# Patient Record
Sex: Female | Born: 1960 | Race: White | Hispanic: No | Marital: Married | Smoking: Current every day smoker
Health system: Southern US, Community
[De-identification: ages and names within clinical notes are randomized; demographics above are authoritative.]

## PROBLEM LIST (undated history)

## (undated) DIAGNOSIS — G8929 Other chronic pain: Secondary | ICD-10-CM

## (undated) DIAGNOSIS — K219 Gastro-esophageal reflux disease without esophagitis: Secondary | ICD-10-CM

## (undated) DIAGNOSIS — N9983 Residual ovary syndrome: Secondary | ICD-10-CM

## (undated) HISTORY — DX: Other chronic pain: G89.29

## (undated) HISTORY — DX: Residual ovary syndrome: N99.83

## (undated) HISTORY — DX: Gastro-esophageal reflux disease without esophagitis: K21.9

---

## 1998-06-12 HISTORY — PX: ABDOMINAL HYSTERECTOMY: SHX81

## 2002-06-12 DIAGNOSIS — N9983 Residual ovary syndrome: Secondary | ICD-10-CM

## 2002-06-12 HISTORY — PX: TUMOR REMOVAL: SHX12

## 2002-06-12 HISTORY — DX: Residual ovary syndrome: N99.83

## 2006-06-12 HISTORY — PX: FRACTURE SURGERY: SHX138

## 2011-03-22 LAB — HM MAMMOGRAPHY

## 2011-03-30 LAB — HM PAP SMEAR: HM PAP: NORMAL

## 2014-07-07 ENCOUNTER — Encounter: Payer: Self-pay | Admitting: Family Medicine

## 2014-07-07 DIAGNOSIS — K219 Gastro-esophageal reflux disease without esophagitis: Secondary | ICD-10-CM | POA: Insufficient documentation

## 2014-07-07 DIAGNOSIS — G8929 Other chronic pain: Secondary | ICD-10-CM | POA: Insufficient documentation

## 2014-07-22 ENCOUNTER — Encounter: Payer: Self-pay | Admitting: Physician Assistant

## 2014-07-22 ENCOUNTER — Ambulatory Visit (INDEPENDENT_AMBULATORY_CARE_PROVIDER_SITE_OTHER): Admitting: Physician Assistant

## 2014-07-22 VITALS — BP 124/70 | HR 72 | Temp 97.8°F | Resp 18 | Ht 66.5 in | Wt 137.0 lb

## 2014-07-22 DIAGNOSIS — F17200 Nicotine dependence, unspecified, uncomplicated: Secondary | ICD-10-CM

## 2014-07-22 DIAGNOSIS — G8929 Other chronic pain: Secondary | ICD-10-CM

## 2014-07-22 DIAGNOSIS — Z720 Tobacco use: Secondary | ICD-10-CM

## 2014-07-22 DIAGNOSIS — K219 Gastro-esophageal reflux disease without esophagitis: Secondary | ICD-10-CM

## 2014-07-22 MED ORDER — OXYCODONE-ACETAMINOPHEN 10-325 MG PO TABS: 1.0000 | ORAL_TABLET | Freq: Four times a day (QID) | ORAL | Status: DC | PRN

## 2014-07-22 MED ORDER — OXYCODONE HCL ER 80 MG PO T12A
80.0000 mg | EXTENDED_RELEASE_TABLET | Freq: Two times a day (BID) | ORAL | Status: DC
Start: 2014-07-22 — End: 2014-08-19

## 2014-07-22 NOTE — Progress Notes (Signed)
Patient ID: Ebelyn Bohnet MRN: [id number], DOB: 1961/01/20, 54 y.o. Date of Encounter: @  Chief Complaint:  Chief Complaint  Patient presents with  . New Patient--Establish    needs to discuss pain management, has chronic leg pain s/p accident    HPI: 54 y.o. year old white female  presents as new patient to establish care.  She states that she just recently moved here from [place]. However only was in [place] for about one year. Prior to that she was in [place]. "Had been in [place] forever ".  Says that while she was living in [place] she just continue to go back to her prior PCP in [place].    She had an injury from a horse accident. Has scars on her left leg from the surgery. She says that there is a metal plate there with lots of pins in her leg still. Says that it was her PCP in [place] who has been prescribing her pain medications. Never been seen in  a pain clinic. Says that that PCP gradually had to increase the dose. She was on Oxy 40 mg for couple of years then gradually went up to  for a while and then up to 80 mg now.  Says that she last saw gynecologist around 2008. Since then she was just seeing her PCP for GYN care. Asked about her Estratest and she says that she was put on some type of hormone starting around 1995.  Says now she remembers that she was switched to the Estratest in the year 2000.  Says at that time she was 54 years old and was divorced but was in a new relationship and complained of decreased libido so was prescribed Estratest. Has continued on this ever since.  She says that April 26 2014 she had to have all of her teeth removed and dentures placed.  Regarding smoking she says that in the past she was prescribed Wellbutrin. When she was on Wellbutrin she could hardly get out of bed. Says that she didn't care about anything. Says that recently she has been using "Vap" -- however finds that after eating dinner she has to have a real  cigarette.  Says that her last pelvic exam and mammogram were over 1 year ago. She is not fasting today.   Past Medical History  Diagnosis Date  . GERD (gastroesophageal reflux disease)   . Chronic pain   . Ovarian remnant syndrome 2004     Home Meds: Outpatient Prescriptions Prior to Visit  Medication Sig Dispense Refill  . acyclovir (ZOVIRAX) 400 MG tablet Take 400 mg by mouth as needed.     Marland Kitchen estrogen-methylTESTOSTERone (ESTRATEST) 1.25-2.5 MG per tablet Take 1 tablet by mouth daily.    Marland Kitchen omeprazole (PRILOSEC) 20 MG capsule Take 20 mg by mouth daily.    . polyethylene glycol (MIRALAX / GLYCOLAX) packet Take 17 g by mouth daily.    . OxyCODONE (OXYCONTIN) 80 mg T12A 12 hr tablet Take 80 mg by mouth every 12 (twelve) hours.    Marland Kitchen oxyCODONE-acetaminophen (PERCOCET) 10-325 MG per tablet Take 1 tablet by mouth every 6 (six) hours as needed for pain.     No facility-administered medications prior to visit.    Allergies:  Allergies  Allergen Reactions  . Demerol [Meperidine] Other (See Comments)    Hallucinations  . Morphine And Related Itching and Rash  . Sulfa Antibiotics Rash    History   Social History  . Marital Status: Married  Spouse Name: N/A  . Number of Children: N/A  . Years of Education: N/A   Occupational History  . Not on file.   Social History Main Topics  . Smoking status: Current Every Day Smoker -- 0.50 packs/day for 35 years    Types: Cigarettes  . Smokeless tobacco: Never Used  . Alcohol Use: No  . Drug Use: No  . Sexual Activity: Yes    Birth Control/ Protection: Surgical   Other Topics Concern  . 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    When she has to leave for appointments etc. she has to arrange for other family to care for him.    Family History  Problem Relation Age of Onset  . Miscarriages /  Stillbirths Maternal Grandmother   . Alzheimer's disease Maternal Grandmother   . Arthritis Maternal Grandfather   . Hyperlipidemia Maternal Grandfather   . Hypertension Maternal Grandfather   . Stroke Maternal Grandfather   . Arthritis Paternal Grandmother   . Stroke Paternal Grandmother   . Hearing loss Paternal Grandfather   . Hypertension Paternal Grandfather   . Stroke Paternal Grandfather      Review of Systems:  See HPI for pertinent ROS. All other ROS negative.    Physical Exam: Blood pressure 124/70, pulse 72, temperature 97.8 F (36.6 C), temperature source Oral, resp. rate 18, height 5' 6.5" (1.689 m), weight 137 lb (62.143 kg)., Body mass index is 21.78 kg/(m^2). General: WNWD WF. Appears in no acute distress. Neck: Supple. No thyromegaly. No lymphadenopathy. No carotid bruits. Lungs: Clear bilaterally to auscultation without wheezes, rales, or rhonchi. Breathing is unlabored. Heart: RRR with S1 S2. No murmurs, rubs, or gallops. Abdomen: Soft, non-tender, non-distended with normoactive bowel sounds. No hepatomegaly. No rebound/guarding. No obvious abdominal masses. Musculoskeletal:  Strength and tone normal for age. Scar down anterior aspect of left lower leg.  Extremities/Skin: Warm and dry.  No edema.  Neuro: Alert and oriented X 3. Moves all extremities spontaneously. Gait is normal. CNII-XII grossly in tact. Psych:  Responds to questions appropriately with a normal affect.     ASSESSMENT AND PLAN:  54 y.o. year old female with  1. Gastroesophageal reflux disease, esophagitis presence not specified  2. Chronic pain - Ambulatory referral to Pain Clinic - oxyCODONE-acetaminophen (PERCOCET) 10-325 MG per tablet; Take 1 tablet by mouth every 6 (six) hours as needed for pain.  Dispense: 30 tablet; Refill: 0 After I printed this prescription and gave it to her she said that in her prior provider would give her enough of these that she could take them every 6  hours. Commented that she was taking that much of this in addition to the ox CAD twice a day and she said yes. I told her that this #30 would last her until her next follow-up appointment with me. In the meantime we will also be working on getting her in with the pain clinic as I will not continue to prescribe these medications.  - OxyCODONE (OXYCONTIN) 80 mg T12A 12 hr tablet; Take 1 tablet (80 mg total) by mouth every 12 (twelve) hours.  Dispense: 60 tablet; Refill: 0  Today I have reviewed records she has from [place] that indicates that she was indeed prescribed Oxycontin 80mg  #60 and OxyAPAP 10 #120 multiple, multiple times. And that  she had been on pain meds since 2008.   3. Smoker Adverse effects with Wellbutrin in the past. Currently trying to decrease her smoking and using "Vap" instead.   She is agreeable to schedule a follow-up visit in approximately one week as a complete physical exam. She will schedule this early morning so she can come fasting to that appointment.   [address] [place], [place], [facility] 07/22/2014 12:37 PM

## 2014-08-03 ENCOUNTER — Ambulatory Visit (INDEPENDENT_AMBULATORY_CARE_PROVIDER_SITE_OTHER): Admitting: Physician Assistant

## 2014-08-03 ENCOUNTER — Encounter: Payer: Self-pay | Admitting: Physician Assistant

## 2014-08-03 VITALS — BP 116/68 | HR 68 | Temp 98.0°F | Resp 18 | Ht 65.75 in | Wt 139.0 lb

## 2014-08-03 DIAGNOSIS — Z720 Tobacco use: Secondary | ICD-10-CM

## 2014-08-03 DIAGNOSIS — Z23 Encounter for immunization: Secondary | ICD-10-CM

## 2014-08-03 DIAGNOSIS — K219 Gastro-esophageal reflux disease without esophagitis: Secondary | ICD-10-CM

## 2014-08-03 DIAGNOSIS — Z0000 Encounter for general adult medical examination without abnormal findings: Secondary | ICD-10-CM

## 2014-08-03 DIAGNOSIS — G8929 Other chronic pain: Secondary | ICD-10-CM

## 2014-08-03 DIAGNOSIS — R3915 Urgency of urination: Secondary | ICD-10-CM

## 2014-08-03 DIAGNOSIS — N390 Urinary tract infection, site not specified: Secondary | ICD-10-CM

## 2014-08-03 DIAGNOSIS — F17200 Nicotine dependence, unspecified, uncomplicated: Secondary | ICD-10-CM

## 2014-08-03 DIAGNOSIS — R319 Hematuria, unspecified: Secondary | ICD-10-CM

## 2014-08-03 LAB — CBC WITH DIFFERENTIAL/PLATELET
BASOS ABS: 0.1 10*3/uL (ref 0.0–0.1)
Basophils Relative: 1 % (ref 0–1)
EOS PCT: 2 % (ref 0–5)
Eosinophils Absolute: 0.2 10*3/uL (ref 0.0–0.7)
HEMATOCRIT: 46.4 % — AB (ref 36.0–46.0)
Hemoglobin: 15.6 g/dL — ABNORMAL HIGH (ref 12.0–15.0)
LYMPHS ABS: 2.8 10*3/uL (ref 0.7–4.0)
Lymphocytes Relative: 37 % (ref 12–46)
MCH: 31 pg (ref 26.0–34.0)
MCHC: 33.6 g/dL (ref 30.0–36.0)
MCV: 92.1 fL (ref 78.0–100.0)
MONOS PCT: 9 % (ref 3–12)
MPV: 8.9 fL (ref 8.6–12.4)
Monocytes Absolute: 0.7 10*3/uL (ref 0.1–1.0)
Neutro Abs: 3.8 10*3/uL (ref 1.7–7.7)
Neutrophils Relative %: 51 % (ref 43–77)
Platelets: 311 10*3/uL (ref 150–400)
RBC: 5.04 MIL/uL (ref 3.87–5.11)
RDW: 13.6 % (ref 11.5–15.5)
WBC: 7.5 10*3/uL (ref 4.0–10.5)

## 2014-08-03 LAB — URINALYSIS, ROUTINE W REFLEX MICROSCOPIC
GLUCOSE, UA: NEGATIVE mg/dL
Nitrite: POSITIVE — AB
PROTEIN: 100 mg/dL — AB
Specific Gravity, Urine: 1.02 (ref 1.005–1.030)
Urobilinogen, UA: 1 mg/dL (ref 0.0–1.0)
pH: 5.5 (ref 5.0–8.0)

## 2014-08-03 LAB — LIPID PANEL
CHOL/HDL RATIO: 3 ratio
CHOLESTEROL: 122 mg/dL (ref 0–200)
HDL: 41 mg/dL — AB (ref >=46)
LDL Cholesterol: 69 mg/dL (ref 0–99)
Triglycerides: 61 mg/dL (ref <150)
VLDL: 12 mg/dL (ref 0–40)

## 2014-08-03 LAB — URINALYSIS, MICROSCOPIC ONLY
Casts: NONE SEEN
Crystals: NONE SEEN

## 2014-08-03 LAB — COMPLETE METABOLIC PANEL WITH GFR
ALK PHOS: 122 U/L — AB (ref 39–117)
AST: 11 U/L (ref 0–37)
Albumin: 3.9 g/dL (ref 3.5–5.2)
BUN: 9 mg/dL (ref 6–23)
CALCIUM: 9.5 mg/dL (ref 8.4–10.5)
CHLORIDE: 102 meq/L (ref 96–112)
CO2: 25 mEq/L (ref 19–32)
Creat: 0.98 mg/dL (ref 0.50–1.10)
GFR, Est African American: 77 mL/min
GFR, Est Non African American: 67 mL/min
GLUCOSE: 88 mg/dL (ref 70–99)
Potassium: 4.4 mEq/L (ref 3.5–5.3)
Sodium: 140 mEq/L (ref 135–145)
Total Bilirubin: 0.9 mg/dL (ref 0.2–1.2)
Total Protein: 6.7 g/dL (ref 6.0–8.3)

## 2014-08-03 LAB — TSH: TSH: 3.746 u[IU]/mL (ref 0.350–4.500)

## 2014-08-03 MED ORDER — OXYCODONE-ACETAMINOPHEN 10-325 MG PO TABS: 1.0000 | ORAL_TABLET | Freq: Four times a day (QID) | ORAL | Status: DC | PRN

## 2014-08-03 MED ORDER — CIPROFLOXACIN HCL 500 MG PO TABS: 500.0000 mg | ORAL_TABLET | Freq: Two times a day (BID) | ORAL | Status: DC

## 2014-08-03 NOTE — Progress Notes (Signed)
Patient ID: Latasha Martinez MRN: [id number], DOB: [birth year], 54 y.o. Date of Encounter: 08/03/2014,   Chief Complaint: Physical (CPE)  HPI: 54 y.o. y/o white female  here for CPE.   Prior to today's visit, she has only been seen in our office once prior to today. She saw me as a new patient to establish care on 07/22/14. Prior to that she had been receiving [place] medical care in [place] .  At that visit we discussed in detail that she had had an injury from an horse accident and has been on pain medications since then. At that visit I made referral to pain clinic. I also printed prescription for Oxy Contin 80 mg every 12 hours #60. Also printed a prescription for Percocet 10/325 for just #30. After I printed prescription and gave it to her that day, she said that her prior provider was prescribing enough that she could take 1 every 6 hours and that was the way she had been taking it. I even discussed the fact that she was taking that much of the Percocet in addition to the OxyContin's and she said she was. I told her to just use that prescription to hold her over until this follow-up appointment. Today she is needing further prescription for further Percocet. Also I did review records from [place] that indicated that she they have been prescribing OxyContin 80 #60 and OxyAPAP at 10 for #120---- multiple, multiple times.  She also reports that she has been having some dysuria as well as some urgency and frequency and noticing that her urine looks very cloudy.  No other complaints or concerns today.    Review of Systems: Consitutional: No fever, chills, fatigue, night sweats, lymphadenopathy. No significant/unexplained weight changes. Eyes: No visual changes, eye redness, or discharge. ENT/Mouth: No ear pain, sore throat, nasal drainage, or sinus pain. Cardiovascular: No chest pressure,heaviness, tightness or squeezing, even with exertion. No increased shortness of breath or  dyspnea on exertion.No palpitations, edema, orthopnea, PND. Respiratory: No cough, hemoptysis, SOB, or wheezing. Gastrointestinal: No anorexia, dysphagia, reflux, pain, nausea, vomiting, hematemesis, diarrhea, constipation, BRBPR, or melena. Breast: No mass, nodules, bulging, or retraction. No skin changes or inflammation. No nipple discharge. No lymphadenopathy. Genitourinary: No dysuria, hematuria, incontinence, vaginal discharge, pruritis, burning, abnormal bleeding, or pain. Musculoskeletal: Chronic Pain--see HPI Skin: No rash, pruritis, or concerning lesions. Neurological: No headache, dizziness, syncope, seizures, tremors, memory loss, coordination problems, or paresthesias. Psychological: No anxiety, depression, hallucinations, SI/HI. Endocrine: No polydipsia, polyphagia, polyuria, or known diabetes.No increased fatigue. No palpitations/rapid heart rate. No significant/unexplained weight change. All other systems were reviewed and are otherwise negative.  Past Medical History  Diagnosis Date  . GERD (gastroesophageal reflux disease)   . Chronic pain   . Ovarian remnant syndrome 2004     Past Surgical History  Procedure Laterality Date  . Fracture surgery  2008    left leg kicked by horse  . Abdominal hysterectomy  2000  . Tumor removal  2004    ovarian remnant syndrome    Home Meds:  Outpatient Prescriptions Prior to Visit  Medication Sig Dispense Refill  . acyclovir (ZOVIRAX) 400 MG tablet Take 400 mg by mouth as needed.     Marland Kitchen. estrogen-methylTESTOSTERone (ESTRATEST) 1.25-2.5 MG per tablet Take 1 tablet by mouth daily.    Marland Kitchen. omeprazole (PRILOSEC) 20 MG capsule Take 20 mg by mouth daily.    . OxyCODONE (OXYCONTIN) 80 mg T12A 12 hr tablet Take 1 tablet (80 mg total) by  mouth every 12 (twelve) hours. 60 tablet 0  . polyethylene glycol (MIRALAX / GLYCOLAX) packet Take 17 g by mouth daily.    Marland Kitchen oxyCODONE-acetaminophen (PERCOCET) 10-325 MG per tablet Take 1 tablet by mouth every 6  (six) hours as needed for pain. 30 tablet 0   No facility-administered medications prior to visit.    Allergies:  Allergies  Allergen Reactions  . Demerol [Meperidine] Other (See Comments)    Hallucinations  . Morphine And Related Itching and Rash  . Sulfa Antibiotics Rash    History   Social History  . Marital Status: Married    Spouse Name: N/A  . Number of Children: N/A  . Years of Education: N/A   Occupational History  . Not on file.   Social History Main Topics  . Smoking status: Current Every Day Smoker -- 0.50 packs/day for 35 years    Types: Cigarettes  . Smokeless tobacco: Never Used  . Alcohol Use: No  . Drug Use: No  . Sexual Activity: Yes    Birth Control/ Protection: Surgical   Other Topics Concern  . 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    When she has to leave for appointments etc. she has to arrange for other family to care for him.    Family History  Problem Relation Age of Onset  . Miscarriages / Stillbirths Maternal Grandmother   . Alzheimer's disease Maternal Grandmother   . Arthritis Maternal Grandfather   . Hyperlipidemia Maternal Grandfather   . Hypertension Maternal Grandfather   . Stroke Maternal Grandfather   . Arthritis Paternal Grandmother   . Stroke Paternal Grandmother   . Hearing loss Paternal Grandfather   . Hypertension Paternal Grandfather   . Stroke Paternal Grandfather     Physical Exam: Blood pressure 116/68, pulse 68, temperature 98 F (36.7 C), temperature source Oral, resp. rate 18, height 5' 5.75" (1.67 m), weight 139 lb (63.05 kg)., Body mass index is 22.61 kg/(m^2). General: Well developed, well nourished, WF. Appears in no acute distress. HEENT: Normocephalic, atraumatic. Conjunctiva pink, sclera non-icteric. Pupils 2 mm constricting to 1 mm, round, regular, and equally  reactive to light and accomodation. EOMI. Internal auditory canal clear. TMs with good cone of light and without pathology. Nasal mucosa pink. Nares are without discharge. No sinus tenderness. Oral mucosa pink.  Pharynx without exudate.   Neck: Supple. Trachea midline. No thyromegaly. Full ROM. No lymphadenopathy.No Carotid Bruits. Lungs: Clear to auscultation bilaterally without wheezes, rales, or rhonchi. Breathing is of normal effort and unlabored. Cardiovascular: RRR with S1 S2. No murmurs, rubs, or gallops. Distal pulses 2+ symmetrically. No carotid or abdominal bruits. Breast: Symmetrical. No masses. Nipples without discharge. Abdomen: Soft, non-tender, non-distended with normoactive bowel sounds. No hepatosplenomegaly or masses. No rebound/guarding. No CVA tenderness. No hernias.  Genitourinary:  External genitalia without lesions. Vaginal mucosa pink. She has had hysterectomy. Exam c/w this. No masses or tenderness.  Musculoskeletal: Full range of motion and 5/5 strength throughout.  Extremities without clubbing, cyanosis, or edema.  Skin: Warm and moist without erythema, ecchymosis, wounds, or rash. Neuro: A+Ox3. CN II-XII grossly intact. Moves all extremities spontaneously. Full sensation throughout. Normal gait. DTR 2+ throughout upper and lower extremities.  Psych:  Responds to questions appropriately with a normal affect.   Assessment/Plan:  54 y.o. y/o  female here for CPE 1. Visit for preventive health examination  A. Screening Labs: - CBC with Differential/Platelet - COMPLETE METABOLIC PANEL WITH GFR - Lipid panel - TSH - Vit D  25 hydroxy (rtn osteoporosis monitoring)   B. Pap: She has had complete hysterectomy. She states this was not secondary to any cancer. Therefore, no further Pap smear indicated.  C. Screening Mammogram: She has had no mammogram in > 1 year. She is agreeable for me to refer for follow-up mammogram. - MM Digital Screening; Future  D. DEXA/BMD:    Wait to discuss bone density scanning at future visits. Discuss her age at time of her hysterectomy to determine when we need to start doing bone density scans.  E. Colorectal Cancer Screening: She reports she has never had a colonoscopy. She is agreeable to follow-up for screening colonoscopy.  Ambulatory referral to Gastroenterology   F. Immunizations:  Influenza:  She refuses to have influenza vaccine. Tetanus:  She thinks it has been close to 10 years since her last tetanus vaccine and she is agreeable to go ahead and update this today. Pneumococcal:  She reports that she has never had a pneumonia vaccine. Her smoking she needs to have Pneumovax 23. She is agreeable to receive this today.                          Then need to get Prevnar 13 at age 8. Zostavax: Not indicated until age 21.    2. Urgency of urination - Urinalysis, Routine w reflex microscopic  3. Smoker At last office visit we discussed smoking cessation. She reported that she had used Wellbutrin in the past but it caused adverse effects. He is trying to decrease the amount of smoking on her own.  4. Chronic pain At her visit 07/22/14 I gave her prescription for #30 of Percocet. However she says that she takes one 4 times daily, which is 120 per month.  Therefore I will give her the other 90 now to cover for the month supply.  - oxyCODONE-acetaminophen (PERCOCET) 10-325 MG per tablet; Take 1 tablet by mouth every 6 (six) hours as needed for pain.  Dispense: 90 tablet; Refill: 0  At her visit 07/22/14 I gave her a full month supply of her OxyContin.  Also I ordered referral to pain clinic at her visit 07/22/14.  5. Gastroesophageal reflux disease, esophagitis presence not specified   6. Urinary tract infection with hematuria, site unspecified - ciprofloxacin (CIPRO) 500 MG tablet; Take 1 tablet (500 mg total) by mouth 2 (two) times daily.  Dispense: 10 tablet; Refill: 0  7. Need for Tdap vaccination - Tdap  vaccine greater than or equal to 7yo IM  8. Need for prophylactic vaccination against Streptococcus pneumoniae (pneumococcus) - Pneumococcal polysaccharide vaccine 23-valent greater than or equal to 2yo subcutaneous/IM   Signed, [address] [place], [place], [facility] 08/03/2014 1:49 PM

## 2014-08-04 ENCOUNTER — Encounter: Payer: Self-pay | Admitting: *Deleted

## 2014-08-04 ENCOUNTER — Encounter (INDEPENDENT_AMBULATORY_CARE_PROVIDER_SITE_OTHER): Payer: Self-pay | Admitting: *Deleted

## 2014-08-04 LAB — VITAMIN D 25 HYDROXY (VIT D DEFICIENCY, FRACTURES): Vit D, 25-Hydroxy: 12 ng/mL — ABNORMAL LOW (ref 30–100)

## 2014-08-06 ENCOUNTER — Other Ambulatory Visit: Payer: Self-pay | Admitting: Family Medicine

## 2014-08-06 ENCOUNTER — Telehealth: Payer: Self-pay | Admitting: Family Medicine

## 2014-08-06 DIAGNOSIS — E559 Vitamin D deficiency, unspecified: Secondary | ICD-10-CM

## 2014-08-06 MED ORDER — VITAMIN D (ERGOCALCIFEROL) 1.25 MG (50000 UNIT) PO CAPS: 50000.0000 [IU] | ORAL_CAPSULE | ORAL | Status: DC

## 2014-08-06 NOTE — Telephone Encounter (Signed)
-----   Message from Dorena BodoMary B Dixon, PA-C sent at 08/04/2014  2:48 PM EST ----- Add Vitamin D Deficiency to her Problem List so I will make sure to f/u this at next OV. Thanks.

## 2014-08-06 NOTE — Telephone Encounter (Signed)
-----   Message from Dorena BodoMary B Dixon, PA-C sent at 08/04/2014  2:47 PM EST ----- Treated UTI at OV. Vitamin D very low. Tell her to take Rx---Ergocalciferol 50,000 IU Q Week x 12 Weeks. ------THEN take otc VitD 4,000 IU QD ---make sure to have f/u OV 6 months--will Recheck level then Remainder of labs normal.

## 2014-08-06 NOTE — Telephone Encounter (Signed)
Tried to call pt, voice mail full.  Rx for Vit D to pharmacy.  Pt already has 6 mth appt.

## 2014-08-06 NOTE — Telephone Encounter (Signed)
error 

## 2014-08-11 NOTE — Telephone Encounter (Signed)
Finally able to reach pt.  Aware of Vit D Rx and how to take.  Switch to OTC when Rx completed.  Repeat in 6 months

## 2014-08-18 ENCOUNTER — Telehealth: Payer: Self-pay | Admitting: Physician Assistant

## 2014-08-18 DIAGNOSIS — G8929 Other chronic pain: Secondary | ICD-10-CM

## 2014-08-18 NOTE — Telephone Encounter (Signed)
Patient is calling to get refills on her oxycontin and oxycodone  [phone]

## 2014-08-18 NOTE — Telephone Encounter (Signed)
Oxycontin was RF 07/22/14 #60  Oxycodone (Percocet) was Rf 08/03/14 #90  Last OV 08/03/14  OK refill?

## 2014-08-19 MED ORDER — OXYCODONE-ACETAMINOPHEN 10-325 MG PO TABS: 1.0000 | ORAL_TABLET | Freq: Four times a day (QID) | ORAL | Status: DC | PRN

## 2014-08-19 MED ORDER — OXYCODONE HCL ER 80 MG PO T12A: 80.0000 mg | EXTENDED_RELEASE_TABLET | Freq: Two times a day (BID) | ORAL | Status: DC

## 2014-08-19 NOTE — Telephone Encounter (Signed)
Rx's printed.  Tried to call pt.  No answer, voice mail full.

## 2014-08-19 NOTE — Telephone Encounter (Signed)
FYI--- referral to pain clinic was ordered 07/22/14 Patient was seen as a new patient at that time. Can print prescription for the oxycodone 10+325 one every 6 hours for #120+0. (Previously I have given her one for #30 and another one for #90 because I did not know at that time that she usually gets 120 per month) Also can print prescription for OxyContin 80 mg 1 every 12 hours for #60+0.

## 2014-08-28 ENCOUNTER — Other Ambulatory Visit: Payer: Self-pay | Admitting: Physician Assistant

## 2014-08-28 DIAGNOSIS — Z1231 Encounter for screening mammogram for malignant neoplasm of breast: Secondary | ICD-10-CM

## 2014-09-15 ENCOUNTER — Ambulatory Visit: Payer: Self-pay

## 2014-09-17 ENCOUNTER — Telehealth: Payer: Self-pay | Admitting: Physician Assistant

## 2014-09-17 DIAGNOSIS — G8929 Other chronic pain: Secondary | ICD-10-CM

## 2014-09-17 MED ORDER — OXYCODONE-ACETAMINOPHEN 10-325 MG PO TABS: 1.0000 | ORAL_TABLET | Freq: Four times a day (QID) | ORAL | Status: DC | PRN

## 2014-09-17 MED ORDER — ACYCLOVIR 400 MG PO TABS: 400.0000 mg | ORAL_TABLET | Freq: Three times a day (TID) | ORAL | Status: DC | PRN

## 2014-09-17 MED ORDER — OXYCODONE HCL ER 80 MG PO T12A: 80.0000 mg | EXTENDED_RELEASE_TABLET | Freq: Two times a day (BID) | ORAL | Status: DC

## 2014-09-17 NOTE — Telephone Encounter (Signed)
rx signed by provider.  No answer when tried to call patient

## 2014-09-17 NOTE — Telephone Encounter (Signed)
Patient is calling to get refills on OxyContin, Oxycodone, and acyclovir  Please call her at [phone] if any questions

## 2014-09-17 NOTE — Telephone Encounter (Signed)
Approved for #120+0 of the oxycodone 10+325 Approved for #60+0 of the OxyContin 80 BUT, MUST F/U STATUS OF REFERRAL TO PAIN CLINIC AND DOCUMENT IN EPIC  SO WILL HAVE THIS INFO AVAILABLE PRIOR TO NEXT REFILL.

## 2014-09-17 NOTE — Telephone Encounter (Signed)
LRF's both 08/19/14  LOV 08/03/14  OK refill?

## 2014-09-18 NOTE — Telephone Encounter (Signed)
Tried to call pt again.  No answer and mail box is full.  Rx's are ready for pick up

## 2014-10-02 ENCOUNTER — Encounter: Payer: Self-pay | Admitting: *Deleted

## 2014-10-14 ENCOUNTER — Telehealth: Payer: Self-pay | Admitting: *Deleted

## 2014-10-14 NOTE — Telephone Encounter (Signed)
She had CPE 07/2014--Can refill to last until 07/2015--7 month supply

## 2014-10-14 NOTE — Telephone Encounter (Signed)
Pt is needing a refill on Estratest 1.25-2.5mg  says she is out and would like jto have these today.  Pt has new pharmacy [facility]

## 2014-10-15 MED ORDER — EST ESTROGENS-METHYLTEST 1.25-2.5 MG PO TABS: 1.0000 | ORAL_TABLET | Freq: Every day | ORAL | Status: DC

## 2014-10-15 NOTE — Telephone Encounter (Signed)
RX faxed to pharmacy.

## 2014-10-20 ENCOUNTER — Telehealth: Payer: Self-pay | Admitting: Physician Assistant

## 2014-10-20 NOTE — Telephone Encounter (Signed)
Last refill 09/17/14 for one month no refills.  Last office visit 08/03/14.  OK refill?

## 2014-10-20 NOTE — Telephone Encounter (Signed)
Patient needs rx for OxyContin and oxycodone   [phone] call when ready

## 2014-10-21 NOTE — Telephone Encounter (Signed)
Find out status of referral to pain clinic. 07/22/14 is when I ordered the referral to pain clinic.

## 2014-10-21 NOTE — Telephone Encounter (Signed)
Somebody is going to have to start calling this other provider's office multiple times a day to get these records until they send them to us.  It has been 3 MONTHS.  It takes 5 minutes to fax records.  Call and harass that office until they sen records!!!! I cannot continue prescribing these meds--has been 3 months. This is ridiculous!!

## 2014-10-21 NOTE — Telephone Encounter (Signed)
Dr Laurian Brim'Toole is waiting for records from previous providers.  We have sent for and are still waiting.  Leeroy BockChelsea is following up on record requests.

## 2014-10-22 ENCOUNTER — Ambulatory Visit (INDEPENDENT_AMBULATORY_CARE_PROVIDER_SITE_OTHER): Admitting: Physician Assistant

## 2014-10-22 ENCOUNTER — Encounter: Payer: Self-pay | Admitting: Physician Assistant

## 2014-10-22 VITALS — BP 120/70 | HR 57 | Temp 98.5°F | Resp 19 | Wt 132.0 lb

## 2014-10-22 DIAGNOSIS — M2662 Arthralgia of temporomandibular joint: Secondary | ICD-10-CM | POA: Diagnosis not present

## 2014-10-22 DIAGNOSIS — G8929 Other chronic pain: Secondary | ICD-10-CM | POA: Diagnosis not present

## 2014-10-22 DIAGNOSIS — M26629 Arthralgia of temporomandibular joint, unspecified side: Secondary | ICD-10-CM | POA: Insufficient documentation

## 2014-10-22 MED ORDER — OXYCODONE-ACETAMINOPHEN 10-325 MG PO TABS: 1.0000 | ORAL_TABLET | Freq: Four times a day (QID) | ORAL | Status: DC | PRN

## 2014-10-22 MED ORDER — OXYCODONE HCL ER 80 MG PO T12A: 80.0000 mg | EXTENDED_RELEASE_TABLET | Freq: Two times a day (BID) | ORAL | Status: DC

## 2014-10-22 NOTE — Progress Notes (Addendum)
Patient ID: Latasha Martinez MRN: [id number], DOB: 1960/10/01, 54 y.o. Date of Encounter: 10/22/2014, 4:32 PM    Chief Complaint:  Chief Complaint  Patient presents with  . right ear pain    yesterday night      HPI: 54 y.o. year old female resents with complaints of right ear pain.  She says that several weeks ago she woke up one night with severe pain in her right ear. However says that the following morning the ear was not hurting. He thought maybe she was just lying on her ear wrong and had pressed it. Says that a few nights later it happened again. However again no pain during the day. Says night before last was the last time she had severe pain. After that is when she called us and schedule appointment.  Says that every episode of severe pain has occurred at night. Says that sometimes the following day that ear feels a little bit sore but no significant pain like at night.  Has had no mucus from her nose no sore throat no cough or chest congestion.  She does grind her teeth at night. Also says that since she got these dentures she catches herself during the day even tapping her teeth together.     Home Meds:   Outpatient Prescriptions Prior to Visit  Medication Sig Dispense Refill  . acyclovir (ZOVIRAX) 400 MG tablet Take 1 tablet (400 mg total) by mouth 3 (three) times daily as needed. 90 tablet 0  . estrogen-methylTESTOSTERone (ESTRATEST) 1.25-2.5 MG per tablet Take 1 tablet by mouth daily. 30 tablet 5  . omeprazole (PRILOSEC) 20 MG capsule Take 20 mg by mouth daily.    . polyethylene glycol (MIRALAX / GLYCOLAX) packet Take 17 g by mouth daily.    . Vitamin D, Ergocalciferol, (DRISDOL) 50000 UNITS CAPS capsule Take 1 capsule (50,000 Units total) by mouth every 7 (seven) days. One tablet a week x 12 weeks, then start vitamin D3 4000 IU daily, over the counter. 12 capsule 0  . OxyCODONE (OXYCONTIN) 80 mg T12A 12 hr tablet Take 1 tablet (80 mg total) by mouth every 12  (twelve) hours. 60 tablet 0  . oxyCODONE-acetaminophen (PERCOCET) 10-325 MG per tablet Take 1 tablet by mouth every 6 (six) hours as needed for pain. 120 tablet 0  . ciprofloxacin (CIPRO) 500 MG tablet Take 1 tablet (500 mg total) by mouth 2 (two) times daily. (Patient not taking: Reported on 10/22/2014) 10 tablet 0   No facility-administered medications prior to visit.    Allergies:  Allergies  Allergen Reactions  . Demerol [Meperidine] Other (See Comments)    Hallucinations  . Morphine And Related Itching and Rash  . Sulfa Antibiotics Rash      Review of Systems: See HPI for pertinent ROS. All other ROS negative.    Physical Exam: Blood pressure 120/70, pulse 57, temperature 98.5 F (36.9 C), temperature source Oral, resp. rate 19, weight 132 lb (59.875 kg)., Body mass index is 21.47 kg/(m^2). General:  Appears in no acute distress. HEENT:Left Ear: Normal.  Right Ear: Normal.  The canals and tympanic membranes appear normally bilaterally. There is severe tenderness with palpation of the superior aspect of the right TM joint. Neck: Supple. No thyromegaly. No lymphadenopathy. Lungs: Clear bilaterally to auscultation without wheezes, rales, or rhonchi. Breathing is unlabored. Heart: Regular rhythm. No murmurs, rubs, or gallops. Msk:  Strength and tone normal for age. Extremities/Skin: Warm and dry. Neuro: Alert and oriented X 3. Moves  all extremities spontaneously. Gait is normal. CNII-XII grossly in tact. Psych:  Responds to questions appropriately with a normal affect.     ASSESSMENT AND PLAN:  54 y.o. year old female with  1. TMJ arthralgia She is to call her dentist and see about a mouth guard to prevent grinding/clenching. Also discussed avoiding hard crunchy foods and to eat soft foods. So avoid taking large bites of food such as a big hamburger/sandwich.  2. Chronic pain She transferred here as a new patient and was already on these pain medications. At the time that  we accepted her as a new patient she was aware that she would need to see pain clinic for these medications. Then in the process of getting her in with the pain clinic since then. She recently called needing refills and I had stated that we needed to find out the status of the pain clinic. Today patient states that Dr. Laurian Brim'Toole did call her and they did discuss some information but he was waiting to get her records to see if their clinic could be of any help to her. I talked to our staff today. States that they did just receive these records yesterday and that they have faxed them to Dr. Laurian Brim'Toole yesterday. Give her another month's supply to hold her over and hopefully she will be seeing the pain clinic after this.  - oxyCODONE-acetaminophen (PERCOCET) 10-325 MG per tablet; Take 1 tablet by mouth every 6 (six) hours as needed for pain.  Dispense: 120 tablet; Refill: 0 - OxyCODONE (OXYCONTIN) 80 mg T12A 12 hr tablet; Take 1 tablet (80 mg total) by mouth every 12 (twelve) hours.  Dispense: 60 tablet; Refill: 0   Signed, [address], [place], [facility] 10/22/2014 4:32 PM

## 2014-10-23 ENCOUNTER — Encounter: Payer: Self-pay | Admitting: Physician Assistant

## 2014-11-11 ENCOUNTER — Encounter: Payer: Self-pay | Admitting: Physician Assistant

## 2014-11-20 ENCOUNTER — Telehealth: Payer: Self-pay | Admitting: Physician Assistant

## 2014-11-20 DIAGNOSIS — G8929 Other chronic pain: Secondary | ICD-10-CM

## 2014-11-20 NOTE — Telephone Encounter (Signed)
Patient calling for refill of oxycontin 80mg  and oxycodone 10-325 mg [phone]

## 2014-11-20 NOTE — Telephone Encounter (Signed)
Ok to refill both??  Last office visit/ refill 10/22/2014.

## 2014-11-23 MED ORDER — OXYCODONE HCL ER 80 MG PO T12A: 80.0000 mg | EXTENDED_RELEASE_TABLET | Freq: Two times a day (BID) | ORAL | Status: DC

## 2014-11-23 MED ORDER — OXYCODONE-ACETAMINOPHEN 10-325 MG PO TABS
1.0000 | ORAL_TABLET | Freq: Four times a day (QID) | ORAL | Status: DC | PRN
Start: 2014-11-23 — End: 2014-12-23

## 2014-11-23 NOTE — Telephone Encounter (Signed)
Ok to refill both?? 

## 2014-11-23 NOTE — Telephone Encounter (Signed)
Prescription printed.   VM full and cannot accept new messages.

## 2014-11-30 ENCOUNTER — Encounter: Payer: Self-pay | Admitting: Physician Assistant

## 2014-12-23 ENCOUNTER — Other Ambulatory Visit: Payer: Self-pay | Admitting: Physician Assistant

## 2014-12-23 DIAGNOSIS — G8929 Other chronic pain: Secondary | ICD-10-CM

## 2014-12-23 MED ORDER — OXYCODONE-ACETAMINOPHEN 10-325 MG PO TABS: 1.0000 | ORAL_TABLET | Freq: Four times a day (QID) | ORAL | Status: DC | PRN

## 2014-12-23 MED ORDER — OXYCODONE HCL ER 80 MG PO T12A: 80.0000 mg | EXTENDED_RELEASE_TABLET | Freq: Two times a day (BID) | ORAL | Status: DC

## 2014-12-23 NOTE — Telephone Encounter (Signed)
I called patient and Dr Jon Gills'Toole's office.    Pt said nothing about having an appointment.  When asked about scheduled appt for 11/13/14, then said did not gone because of death in family.  Also told me she has spoken to office maybe 1.5-2 weeks ago and they told her they had rec'd her records and were reviewing and would get back to her if they felt they could help her.  Dr Jon Gills'Toole's office said they had already rec'd notes at time of initial referral and had called her with 11/13/14 appt which she NO SHOWED for.  No record of any conversations with her since then.  They gave me another appt for her for 01/14/15 at 8AM.  If no shows they will not see her.  LRFs 11/23/14  OK refill?

## 2014-12-23 NOTE — Telephone Encounter (Signed)
Patient needs refill on her OxyContin and oxycodone if possible please call her at  [phone] when ready to pick up

## 2014-12-23 NOTE — Telephone Encounter (Signed)
  TELL HER THAT IF SHE MISSES APPOINTMENT AT PAIN CLINIC  8/4//16, I WILL NOT CONTINUE TO   RX MEDS   Can go ahead and print prescription for the Oxy 10mg   #120+0 Print  the OxyContin 80mg  for #60+0

## 2014-12-23 NOTE — Telephone Encounter (Signed)
Pt called and told new Rx's can be picked up today.  Given new pain mgmt appt date for 01/14/15 at 8 AM.  Told if misses this appt they will not see her again.  Told her if she misses this appt we WILL NOT refill her medications again.

## 2014-12-24 ENCOUNTER — Telehealth: Payer: Self-pay | Admitting: Family Medicine

## 2014-12-24 NOTE — Telephone Encounter (Signed)
rec'd message from patient pharmacy.  Pt has been dispensed Nalaxone (narcan nasal spray) per [facility] standing order.  Any questions call Marcene DuosAmanda Crouch, PhD at [phone].

## 2015-01-05 ENCOUNTER — Other Ambulatory Visit: Payer: Self-pay | Admitting: Physician Assistant

## 2015-01-06 NOTE — Telephone Encounter (Signed)
Medication refilled per protocol. 

## 2015-02-01 ENCOUNTER — Ambulatory Visit: Admitting: Physician Assistant

## 2015-04-16 ENCOUNTER — Other Ambulatory Visit: Payer: Self-pay | Admitting: Family Medicine

## 2015-04-16 MED ORDER — EST ESTROGENS-METHYLTEST 1.25-2.5 MG PO TABS: 1.0000 | ORAL_TABLET | Freq: Every day | ORAL | Status: DC

## 2015-04-16 NOTE — Telephone Encounter (Signed)
Medication refilled per protocol. 

## 2015-07-03 ENCOUNTER — Other Ambulatory Visit: Payer: Self-pay | Admitting: Physician Assistant

## 2015-09-29 ENCOUNTER — Ambulatory Visit: Admitting: Physician Assistant

## 2015-10-07 ENCOUNTER — Encounter: Payer: Self-pay | Admitting: Physician Assistant

## 2015-10-07 ENCOUNTER — Ambulatory Visit (INDEPENDENT_AMBULATORY_CARE_PROVIDER_SITE_OTHER): Admitting: Physician Assistant

## 2015-10-07 VITALS — BP 122/82 | HR 68 | Temp 98.0°F | Resp 18 | Wt 143.0 lb

## 2015-10-07 DIAGNOSIS — R309 Painful micturition, unspecified: Secondary | ICD-10-CM | POA: Diagnosis not present

## 2015-10-07 DIAGNOSIS — N390 Urinary tract infection, site not specified: Secondary | ICD-10-CM | POA: Diagnosis not present

## 2015-10-07 LAB — URINALYSIS, ROUTINE W REFLEX MICROSCOPIC
Bilirubin Urine: NEGATIVE
GLUCOSE, UA: NEGATIVE
Nitrite: POSITIVE — AB
Specific Gravity, Urine: 1.01 (ref 1.001–1.035)
pH: 5.5 (ref 5.0–8.0)

## 2015-10-07 LAB — URINALYSIS, MICROSCOPIC ONLY
CASTS: NONE SEEN [LPF]
CRYSTALS: NONE SEEN [HPF]
Yeast: NONE SEEN [HPF]

## 2015-10-07 MED ORDER — CIPROFLOXACIN HCL 500 MG PO TABS: 500.0000 mg | ORAL_TABLET | Freq: Two times a day (BID) | ORAL | Status: DC

## 2015-10-07 NOTE — Progress Notes (Signed)
Patient ID: Latasha Martinez MRN: [id number], DOB: 11/29/1960, 55 y.o. Date of Encounter: 10/07/2015, 12:00 PM    Chief Complaint:  Chief Complaint  Patient presents with  . c/o UTI    burning, painful urination     HPI: 55 y.o. year old white female presents with above.   Says that she has a history of having UTIs in the past. Says that she was quite certain that this was what was causing her symptoms. Has been having burning with urination for several days now.Unable to come into office until today secondary to her schedule.  No fever/chills. Some left-sided back pain but pt thinks this is musculoskeletal.      Home Meds:   Outpatient Prescriptions Prior to Visit  Medication Sig Dispense Refill  . acyclovir (ZOVIRAX) 400 MG tablet Take 1 tablet (400 mg total) by mouth 3 (three) times daily as needed. 90 tablet 0  . estrogen-methylTESTOSTERone (ESTRATEST) 1.25-2.5 MG tablet Take 1 tablet by mouth daily. 30 tablet 5  . omeprazole (PRILOSEC) 20 MG capsule TAKE 1 CAPSULE BY MOUTH EVERY DAY 90 capsule 1  . oxyCODONE-acetaminophen (PERCOCET) 10-325 MG per tablet Take 1 tablet by mouth every 6 (six) hours as needed for pain. 120 tablet 0  . polyethylene glycol (MIRALAX / GLYCOLAX) packet Take 17 g by mouth daily.    . Vitamin D, Ergocalciferol, (DRISDOL) 50000 UNITS CAPS capsule Take 1 capsule (50,000 Units total) by mouth every 7 (seven) days. One tablet a week x 12 weeks, then start vitamin D3 4000 IU daily, over the counter. (Patient not taking: Reported on 10/07/2015) 12 capsule 0  . ciprofloxacin (CIPRO) 500 MG tablet Take 1 tablet (500 mg total) by mouth 2 (two) times daily. (Patient not taking: Reported on 10/22/2014) 10 tablet 0  . OxyCODONE (OXYCONTIN) 80 mg T12A 12 hr tablet Take 1 tablet (80 mg total) by mouth every 12 (twelve) hours. 60 tablet 0   No facility-administered medications prior to visit.    Allergies:  Allergies  Allergen Reactions  . Demerol [Meperidine]  Other (See Comments)    Hallucinations  . Morphine And Related Itching and Rash  . Sulfa Antibiotics Rash      Review of Systems: See HPI for pertinent ROS. All other ROS negative.    Physical Exam: Blood pressure 122/82, pulse 68, temperature 98 F (36.7 C), temperature source Oral, resp. rate 18, weight 143 lb (64.864 kg)., Body mass index is 23.26 kg/(m^2). General: WNWD WF.  Appears in no acute distress. Neck: Supple. No thyromegaly. No lymphadenopathy. Lungs: Clear bilaterally to auscultation without wheezes, rales, or rhonchi. Breathing is unlabored. Heart: Regular rhythm. No murmurs, rubs, or gallops. Abdomen: Soft, non-tender, non-distended with normoactive bowel sounds. No hepatomegaly. No rebound/guarding. No obvious abdominal masses. Msk:  Strength and tone normal for age. No costophrenic angle tenderness with percussion bilaterally.  Extremities/Skin: Warm and dry.  Neuro: Alert and oriented X 3. Moves all extremities spontaneously. Gait is normal. CNII-XII grossly in tact. Psych:  Responds to questions appropriately with a normal affect.      ASSESSMENT AND PLAN:  55 y.o. year old female with  1. Urinary tract infection, site not specified UA c/w UTI.  She is to start Cipro immediately, take as directed, and complete all of it. Will send culture. F/U if symptoms do not resolve upon completion of abx or if symptoms worsen significantly/develops fever in interim.  - Urine culture - ciprofloxacin (CIPRO) 500 MG tablet; Take 1 tablet (500 mg  total) by mouth 2 (two) times daily.  Dispense: 14 tablet; Refill: 0  2. Painful urination - Urinalysis, Routine w reflex microscopic (not at [facility])   Signed, [facility] [place], [place], [facility] 10/07/2015 12:00 PM

## 2015-10-09 LAB — URINE CULTURE: Colony Count: >=100000

## 2015-10-14 ENCOUNTER — Other Ambulatory Visit: Payer: Self-pay | Admitting: Family Medicine

## 2015-10-14 ENCOUNTER — Encounter: Payer: Self-pay | Admitting: Family Medicine

## 2015-10-14 MED ORDER — EST ESTROGENS-METHYLTEST 1.25-2.5 MG PO TABS: 1.0000 | ORAL_TABLET | Freq: Every day | ORAL | Status: DC

## 2015-10-14 NOTE — Telephone Encounter (Signed)
Medication refill for one time only.  Patient needs to be seen.  Letter sent for patient to call and schedule 

## 2015-11-03 ENCOUNTER — Encounter: Payer: Self-pay | Admitting: Physician Assistant

## 2015-11-03 ENCOUNTER — Ambulatory Visit (INDEPENDENT_AMBULATORY_CARE_PROVIDER_SITE_OTHER): Admitting: Physician Assistant

## 2015-11-03 VITALS — BP 118/78 | HR 76 | Temp 97.9°F | Resp 18 | Ht 66.0 in | Wt 143.0 lb

## 2015-11-03 DIAGNOSIS — Z780 Asymptomatic menopausal state: Secondary | ICD-10-CM | POA: Diagnosis not present

## 2015-11-03 DIAGNOSIS — G8929 Other chronic pain: Secondary | ICD-10-CM | POA: Diagnosis not present

## 2015-11-03 DIAGNOSIS — Z0000 Encounter for general adult medical examination without abnormal findings: Secondary | ICD-10-CM | POA: Diagnosis not present

## 2015-11-03 DIAGNOSIS — E2839 Other primary ovarian failure: Secondary | ICD-10-CM | POA: Diagnosis not present

## 2015-11-03 LAB — CBC WITH DIFFERENTIAL/PLATELET
BASOS PCT: 1 %
Basophils Absolute: 86 cells/uL (ref 0–200)
EOS PCT: 1 %
Eosinophils Absolute: 86 cells/uL (ref 15–500)
HCT: 48.3 % — ABNORMAL HIGH (ref 35.0–45.0)
Hemoglobin: 16.2 g/dL — ABNORMAL HIGH (ref 12.0–15.0)
LYMPHS ABS: 3440 {cells}/uL (ref 850–3900)
LYMPHS PCT: 40 %
MCH: 31.2 pg (ref 27.0–33.0)
MCHC: 33.5 g/dL (ref 32.0–36.0)
MCV: 92.9 fL (ref 80.0–100.0)
MONOS PCT: 7 %
MPV: 8.9 fL (ref 7.5–12.5)
Monocytes Absolute: 602 cells/uL (ref 200–950)
NEUTROS ABS: 4386 {cells}/uL (ref 1500–7800)
Neutrophils Relative %: 51 %
Platelets: 310 10*3/uL (ref 140–400)
RBC: 5.2 MIL/uL — ABNORMAL HIGH (ref 3.80–5.10)
RDW: 13.3 % (ref 11.0–15.0)
WBC: 8.6 10*3/uL (ref 3.8–10.8)

## 2015-11-03 LAB — COMPLETE METABOLIC PANEL WITH GFR
ALBUMIN: 4.3 g/dL (ref 3.6–5.1)
ALK PHOS: 102 U/L (ref 33–130)
ALT: 8 U/L (ref 6–29)
AST: 11 U/L (ref 10–35)
BUN: 9 mg/dL (ref 7–25)
CALCIUM: 9.2 mg/dL (ref 8.6–10.4)
CO2: 22 mmol/L (ref 20–31)
Chloride: 105 mmol/L (ref 98–110)
Creat: 0.85 mg/dL (ref 0.50–1.05)
GFR, EST NON AFRICAN AMERICAN: 78 mL/min (ref >=60)
GFR, Est African American: >89 mL/min (ref >=60)
Glucose, Bld: 79 mg/dL (ref 70–99)
POTASSIUM: 4.4 mmol/L (ref 3.5–5.3)
Sodium: 139 mmol/L (ref 135–146)
Total Bilirubin: 0.4 mg/dL (ref 0.2–1.2)
Total Protein: 7 g/dL (ref 6.1–8.1)

## 2015-11-03 LAB — TSH: TSH: 2.77 m[IU]/L

## 2015-11-03 MED ORDER — EST ESTROGENS-METHYLTEST 1.25-2.5 MG PO TABS: 1.0000 | ORAL_TABLET | Freq: Every day | ORAL | Status: DC

## 2015-11-03 MED ORDER — POLYETHYLENE GLYCOL 3350 17 G PO PACK: 17.0000 g | PACK | Freq: Every day | ORAL | Status: DC

## 2015-11-03 NOTE — Progress Notes (Signed)
Patient ID: Latasha Martinez MRN: [id number], DOB: 17-May-1961, 55 y.o. Date of Encounter: 11/03/2015,   Chief Complaint: Physical (CPE)  HPI: 55 y.o. y/o white female  here for CPE.    She saw me as a new patient to establish care on 07/22/14. Prior to [place]  She is seeing Pain Clinic.   No complaint/concerns today.   Review of Systems: Consitutional: No fever, chills, fatigue, night sweats, lymphadenopathy. No significant/unexplained weight changes. Eyes: No visual changes, eye redness, or discharge. ENT/Mouth: No ear pain, sore throat, nasal drainage, or sinus pain. Cardiovascular: No chest pressure,heaviness, tightness or squeezing, even with exertion. No increased shortness of breath or dyspnea on exertion.No palpitations, edema, orthopnea, PND. Respiratory: No cough, hemoptysis, SOB, or wheezing. Gastrointestinal: No anorexia, dysphagia, reflux, pain, nausea, vomiting, hematemesis, diarrhea, constipation, BRBPR, or melena. Breast: No mass, nodules, bulging, or retraction. No skin changes or inflammation. No nipple discharge. No lymphadenopathy. Genitourinary: No dysuria, hematuria, incontinence, vaginal discharge, pruritis, burning, abnormal bleeding, or pain. Musculoskeletal: Chronic Pain--see HPI Skin: No rash, pruritis, or concerning lesions. Neurological: No headache, dizziness, syncope, seizures, tremors, memory loss, coordination problems, or paresthesias. Psychological: No anxiety, depression, hallucinations, SI/HI. Endocrine: No polydipsia, polyphagia, polyuria, or known diabetes.No increased fatigue. No palpitations/rapid heart rate. No significant/unexplained weight change. All other systems were reviewed and are otherwise negative.  Past Medical History  Diagnosis Date  . GERD (gastroesophageal reflux disease)   . Chronic pain   . Ovarian remnant syndrome 2004     Past Surgical History    Procedure Laterality Date  . Fracture surgery  2008    left leg kicked by horse  . Abdominal hysterectomy  2000  . Tumor removal  2004    ovarian remnant syndrome    Home Meds:  Outpatient Prescriptions Prior to Visit  Medication Sig Dispense Refill  . acyclovir (ZOVIRAX) 400 MG tablet Take 1 tablet (400 mg total) by mouth 3 (three) times daily as needed. 90 tablet 0  . baclofen (LIORESAL) 10 MG tablet Take 1 tablet by mouth 3 (three) times daily as needed.    Marland Kitchen. omeprazole (PRILOSEC) 20 MG capsule TAKE 1 CAPSULE BY MOUTH EVERY DAY 90 capsule 1  . oxyCODONE-acetaminophen (PERCOCET) 10-325 MG per tablet Take 1 tablet by mouth every 6 (six) hours as needed for pain. 120 tablet 0  . OXYCONTIN 10 MG 12 hr tablet Take 1 tablet by mouth 2 (two) times daily.    . polyethylene glycol (MIRALAX / GLYCOLAX) packet Take 17 g by mouth daily.    Marland Kitchen. topiramate (TOPAMAX) 25 MG tablet Take 1 tablet by mouth at bedtime.    . Vitamin D, Ergocalciferol, (DRISDOL) 50000 UNITS CAPS capsule Take 1 capsule (50,000 Units total) by mouth every 7 (seven) days. One tablet a week x 12 weeks, then start vitamin D3 4000 IU daily, over the counter. (Patient not taking: Reported on 10/07/2015) 12 capsule 0  . ciprofloxacin (CIPRO) 500 MG tablet Take 1 tablet (500 mg total) by mouth 2 (two) times daily. 14 tablet 0  . estrogen-methylTESTOSTERone (ESTRATEST) 1.25-2.5 MG tablet Take 1 tablet by mouth daily. 30 tablet 0   No facility-administered medications prior to visit.    Allergies:  Allergies  Allergen Reactions  . Demerol [Meperidine] Other (See Comments)    Hallucinations  . Morphine And Related Itching and Rash  . Sulfa Antibiotics Rash    Social History   Social History  .  Marital Status: Married    Spouse Name: N/A  . Number of Children: N/A  . Years of Education: N/A   Occupational History  . Not on file.   Social History Main Topics  . Smoking status: Current Every Day Smoker -- 0.50 packs/day  for 35 years    Types: Cigarettes  . Smokeless tobacco: Never Used  . Alcohol Use: No  . Drug Use: No  . Sexual Activity: Yes    Birth Control/ Protection: Surgical   Other Topics Concern  . 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    When she has to leave for appointments etc. she has to arrange for other family to care for him.    Family History  Problem Relation Age of Onset  . Miscarriages / Stillbirths Maternal Grandmother   . Alzheimer's disease Maternal Grandmother   . Arthritis Maternal Grandfather   . Hyperlipidemia Maternal Grandfather   . Hypertension Maternal Grandfather   . Stroke Maternal Grandfather   . Arthritis Paternal Grandmother   . Stroke Paternal Grandmother   . Hearing loss Paternal Grandfather   . Hypertension Paternal Grandfather   . Stroke Paternal Grandfather     Physical Exam: Blood pressure 118/78, pulse 76, temperature 97.9 F (36.6 C), temperature source Oral, resp. rate 18, height  (1.676 m), weight 143 lb (64.864 kg)., Body mass index is 23.09 kg/(m^2). General: Well developed, well nourished, WF. Appears in no acute distress. HEENT: Normocephalic, atraumatic. Conjunctiva pink, sclera non-icteric. Pupils 2 mm constricting to 1 mm, round, regular, and equally reactive to light and accomodation. EOMI. Internal auditory canal clear. TMs with good cone of light and without pathology. Nasal mucosa pink. Nares are without discharge. No sinus tenderness. Oral mucosa pink.  Pharynx without exudate.   Neck: Supple. Trachea midline. No thyromegaly. Full ROM. No lymphadenopathy.No Carotid Bruits. Lungs: Clear to auscultation bilaterally without wheezes, rales, or rhonchi. Breathing is of normal effort and unlabored. Cardiovascular: RRR with S1 S2. No murmurs, rubs, or gallops. Distal pulses 2+ symmetrically. No carotid  or abdominal bruits. Breast: Symmetrical. No masses. Nipples without discharge. Abdomen: Soft, non-tender, non-distended with normoactive bowel sounds. No hepatosplenomegaly or masses. No rebound/guarding. No CVA tenderness. No hernias.  Genitourinary:  External genitalia without lesions. Vaginal mucosa pink. She has had hysterectomy. Exam c/w this. No masses or tenderness.  Musculoskeletal: Strength and tone appropriate for age. Skin: Warm and moist without erythema, ecchymosis, wounds, or rash. Neuro: A+Ox3. CN II-XII grossly intact. Moves all extremities spontaneously. Full sensation throughout. Normal gait. DTR 2+ throughout upper and lower extremities.  Psych:  Responds to questions appropriately with a normal affect.   Assessment/Plan:  55 y.o. y/o female here for CPE 1. Visit for preventive health examination  A. Screening Labs: She is not fasting today.  She had FLP 07/2014--FLP was good. Will check other labs now. Can wait to recheck FLP at future visit - CBC with Differential/Platelet - COMPLETE METABOLIC PANEL WITH GFR - TSH - Vit D  25 hydroxy (rtn osteoporosis monitoring)   B. Pap: She has had complete hysterectomy. She states this was not secondary to any cancer. Therefore, no further Pap smear indicated.  C. Screening Mammogram: She has had no mammogram in > 1 year. She is agreeable for me to refer for follow-up mammogram. At her  CPE 08/03/14--she was agreeable, and I ordered-----however, at CPE 10/2015--it is reviewed that she did not go for mammogram. She provides no reason. She says for me to go ahead and re-order- -I discussed whether she plans to actually go or not-- she says she "will go this time" - MM Digital Screening; Future  D. DEXA/BMD:  She reports that one ovary was removed 1991. 2nd ovary removed 2004.  Will go ahead and get DEXA. She is agreeable. Order placed 11/03/15  E. Colorectal Cancer Screening: 08/03/2014: "She reports she has never had a  colonoscopy. She is agreeable to follow-up for screening colonoscopy." At that CPE, I ordered this. However, again, at CPE 10/2015--reviewed--she did not go.  Discussed this again at OV 10/2015. Discussed that if she does not plan to f/u, let me know. Also discussed Cologuard, HemeOccult. She says she will "go this time"  Ambulatory referral to Gastroenterology   F. Immunizations:  Influenza:  She refuses to have influenza vaccine. Tetanus:  Tdap given here 08/03/2014 Pneumococcal:  Given her smoking-- Pneumovax 23--given here 08/03/2014                          --Prevnar 13 at age 33. Zostavax: Not indicated until age 36.    Smoker At prior office visit we discussed smoking cessation. She reported that she had used Wellbutrin in the past but it caused adverse effects. She is trying to decrease the amount of smoking on her own.  Chronic pain --Managed by Pain Clinic  Gastroesophageal reflux disease, esophagitis presence not specified --stable, controlled on current med.   [address] [place], [place], [facility] 11/03/2015 12:18 PM

## 2015-11-04 ENCOUNTER — Other Ambulatory Visit: Payer: Self-pay | Admitting: Family Medicine

## 2015-11-04 LAB — VITAMIN D 25 HYDROXY (VIT D DEFICIENCY, FRACTURES): Vit D, 25-Hydroxy: 15 ng/mL — ABNORMAL LOW (ref 30–100)

## 2015-11-04 MED ORDER — CHOLECALCIFEROL 100 MCG (4000 UT) PO CAPS: 1.0000 | ORAL_CAPSULE | Freq: Every day | ORAL | Status: DC

## 2015-11-16 ENCOUNTER — Encounter (INDEPENDENT_AMBULATORY_CARE_PROVIDER_SITE_OTHER): Payer: Self-pay | Admitting: *Deleted

## 2016-01-03 ENCOUNTER — Telehealth: Payer: Self-pay | Admitting: Physician Assistant

## 2016-01-03 MED ORDER — OMEPRAZOLE 20 MG PO CPDR: DELAYED_RELEASE_CAPSULE | ORAL | 3 refills | Status: DC

## 2016-01-03 NOTE — Telephone Encounter (Signed)
Medication refilled per protocol. 

## 2016-01-20 ENCOUNTER — Other Ambulatory Visit: Payer: Self-pay | Admitting: Physician Assistant

## 2016-01-20 DIAGNOSIS — Z1231 Encounter for screening mammogram for malignant neoplasm of breast: Secondary | ICD-10-CM

## 2016-02-15 ENCOUNTER — Other Ambulatory Visit: Payer: Self-pay

## 2016-02-15 ENCOUNTER — Telehealth: Payer: Self-pay

## 2016-02-15 DIAGNOSIS — G8929 Other chronic pain: Secondary | ICD-10-CM

## 2016-02-15 NOTE — Telephone Encounter (Signed)
Pt states her pain dr wants primary care dr to manage Oxycontin 10mg  and Oxycodone 10-325mg  for about a week. Pt sch an appt for 02-17-16 and stated she will discuss with provider then

## 2016-02-15 NOTE — Telephone Encounter (Signed)
Last OV 11-03-15 Last refill on Oxycodone 10-325mg  12-23-14 Last refill on Oxycontin 10mg   10-01-15 Ok to refill?

## 2016-02-15 NOTE — Telephone Encounter (Signed)
I reviewed my LOV note.  She was seeing Pain Clinic--getting Pain meds from them.

## 2016-02-16 ENCOUNTER — Other Ambulatory Visit: Payer: Self-pay

## 2016-02-16 ENCOUNTER — Ambulatory Visit: Payer: Self-pay

## 2016-02-17 ENCOUNTER — Ambulatory Visit (INDEPENDENT_AMBULATORY_CARE_PROVIDER_SITE_OTHER): Admitting: Physician Assistant

## 2016-02-17 ENCOUNTER — Encounter: Payer: Self-pay | Admitting: Physician Assistant

## 2016-02-17 VITALS — BP 118/70 | HR 77 | Temp 97.8°F | Resp 16 | Wt 149.0 lb

## 2016-02-17 DIAGNOSIS — M549 Dorsalgia, unspecified: Secondary | ICD-10-CM

## 2016-02-17 DIAGNOSIS — G8929 Other chronic pain: Secondary | ICD-10-CM

## 2016-02-17 LAB — URINALYSIS, ROUTINE W REFLEX MICROSCOPIC
Bilirubin Urine: NEGATIVE
GLUCOSE, UA: NEGATIVE
HGB URINE DIPSTICK: NEGATIVE
KETONES UR: NEGATIVE
LEUKOCYTES UA: NEGATIVE
Nitrite: NEGATIVE
PH: 6.5 (ref 5.0–8.0)
PROTEIN: NEGATIVE
Specific Gravity, Urine: <1.005 (ref 1.001–1.035)

## 2016-02-17 MED ORDER — OXYCONTIN 10 MG PO T12A: 10.0000 mg | EXTENDED_RELEASE_TABLET | Freq: Two times a day (BID) | ORAL | 0 refills | Status: DC

## 2016-02-17 MED ORDER — OXYCODONE-ACETAMINOPHEN 10-325 MG PO TABS: 1.0000 | ORAL_TABLET | Freq: Four times a day (QID) | ORAL | 0 refills | Status: AC | PRN

## 2016-02-17 NOTE — Progress Notes (Signed)
Patient ID: Latasha Martinez MRN: [id number], DOB: Feb 04, 1961, 55 y.o. Date of Encounter: 02/17/2016, 3:10 PM    Chief Complaint:  Chief Complaint  Patient presents with  . office visit    upper back pain, when she urinates it's a relief, started a wwek ago      HPI: 55 y.o. year old female presents with above.   She is here to address 2 things:  1-  says that she had an office visit with Dr. Laurian Brim'Toole scheduled for 2 weeks ago. Says that at the time of that appointment she was having diarrhea so called them and they said she could not be seen given her diarrhea. Says that they had no other openings to see her at that location but told her they could see her on another date at the [facility] location at 3 PM. She says that she drove 2 hours to the [facility] location and got there for her 3:00 appointment. Says that the office there had another patient scheduled for 3:00 and did not have her on the schedule. She says that she was extremely upset and frustrated. Says that it took her 2 hours to get there and another 2 hours to get back. They gave her an appointment with Dr. Laurian Brim'Toole for September 19. They told her to see her PCP to get pain medicines to hold her until that appointment. She says that she told them that she had a pain contract signed with Dr. Laurian Brim'Toole and was concerned that she was going to get in trouble if she got pain meds from her PCP. She says that they told her that that was a different office and so that pain contract was not binding to that office and for her to see her PCP to get enough pain meds to hold her until the upcoming appointment which patient states is September 19. Says he has weaned her OxyContin down from 80 mg down to 10 mg twice a day and she takes the other short acting oxycodone every 6 hours.  2--she also wanted to check her urine to see if she has a kidney infection. Points to her left back at around level of costophrenic angle and says that that area has been hurting  and she was afraid it was a kidney infection. Even when I gently touched that area she says that it is sore. She states that she has had no dysuria no frequency no urgency. No fevers or chills.  No other complaints or concerns today.     Home Meds:   Outpatient Medications Prior to Visit  Medication Sig Dispense Refill  . acyclovir (ZOVIRAX) 400 MG tablet Take 1 tablet (400 mg total) by mouth 3 (three) times daily as needed. 90 tablet 0  . baclofen (LIORESAL) 10 MG tablet Take 1 tablet by mouth 3 (three) times daily as needed.    Marland Kitchen. estrogen-methylTESTOSTERone (ESTRATEST) 1.25-2.5 MG tablet Take 1 tablet by mouth daily. 90 tablet 2  . omeprazole (PRILOSEC) 20 MG capsule TAKE 1 CAPSULE BY MOUTH EVERY DAY 90 capsule 3  . polyethylene glycol (MIRALAX / GLYCOLAX) packet Take 17 g by mouth daily. 14 each 3  . topiramate (TOPAMAX) 25 MG tablet Take 1 tablet by mouth at bedtime.    . Vitamin D, Ergocalciferol, (DRISDOL) 50000 UNITS CAPS capsule Take 1 capsule (50,000 Units total) by mouth every 7 (seven) days. One tablet a week x 12 weeks, then start vitamin D3 4000 IU daily, over the counter. 12 capsule 0  .  Cholecalciferol 4000 units CAPS Take 1 capsule (4,000 Units total) by mouth daily. (Patient not taking: Reported on 02/17/2016) 90 capsule 3  . oxyCODONE-acetaminophen (PERCOCET) 10-325 MG per tablet Take 1 tablet by mouth every 6 (six) hours as needed for pain. (Patient not taking: Reported on 02/17/2016) 120 tablet 0  . OXYCONTIN 10 MG 12 hr tablet Take 1 tablet by mouth 2 (two) times daily.     No facility-administered medications prior to visit.     Allergies:  Allergies  Allergen Reactions  . Demerol [Meperidine] Other (See Comments)    Hallucinations  . Morphine And Related Itching and Rash  . Sulfa Antibiotics Rash      Review of Systems: See HPI for pertinent ROS. All other ROS negative.    Physical Exam: Blood pressure 118/70, pulse 77, temperature 97.8 F (36.6 C),  temperature source Oral, resp. rate 16, weight 149 lb (67.6 kg)., Body mass index is 24.05 kg/m. General:  WNWD WF. Appears in no acute distress. Neck: Supple. No thyromegaly. No lymphadenopathy. Lungs: Clear bilaterally to auscultation without wheezes, rales, or rhonchi. Breathing is unlabored. Heart: Regular rhythm. No murmurs, rubs, or gallops. Msk:  Strength and tone normal for age. Tenderness with palpation of  left back at ~ T9 level Extremities/Skin: Warm and dry. Neuro: Alert and oriented X 3. Moves all extremities spontaneously. Gait is normal. CNII-XII grossly in tact. Psych:  Responds to questions appropriately with a normal affect.   Results for orders placed or performed in visit on 02/17/16  Urinalysis, Routine w reflex microscopic (not at [facility])  Result Value Ref Range   Color, Urine YELLOW YELLOW   APPearance CLOUDY (A) CLEAR   Specific Gravity, Urine <1.005 1.001 - 1.035   pH 6.5 5.0 - 8.0   Glucose, UA NEGATIVE NEGATIVE   Bilirubin Urine NEGATIVE NEGATIVE   Ketones, ur NEGATIVE NEGATIVE   Hgb urine dipstick NEGATIVE NEGATIVE   Protein, ur NEGATIVE NEGATIVE   Nitrite NEGATIVE NEGATIVE   Leukocytes, UA NEGATIVE NEGATIVE     ASSESSMENT AND PLAN:  55 y.o. year old female with  1. Bilateral back pain, unspecified location Reassured her that urinalysis shows no UTI. Feel that her back pain is musculoskeletal. She is on chronic pain medications. She will follow-up with Dr. Laurian Brim regarding this. - Urinalysis, Routine w reflex microscopic (not at [facility])  2. Chronic pain - oxyCODONE-acetaminophen (PERCOCET) 10-325 MG tablet; Take 1 tablet by mouth every 6 (six) hours as needed for pain.  Dispense: 48 tablet; Refill: 0 - OXYCONTIN 10 MG 12 hr tablet; Take 1 tablet (10 mg total) by mouth 2 (two) times daily.  Dispense: 24 tablet; Refill: 0 She is to use these until her follow-up appointment with Dr. Laurian Brim, which is scheduled for September 19-- per patient  report.  Signed, [address] [place], [place], [facility] 02/17/2016 3:10 PM

## 2016-02-24 ENCOUNTER — Ambulatory Visit: Payer: Self-pay

## 2016-02-24 ENCOUNTER — Other Ambulatory Visit: Payer: Self-pay

## 2016-03-03 ENCOUNTER — Ambulatory Visit: Payer: Self-pay

## 2016-03-03 ENCOUNTER — Other Ambulatory Visit: Payer: Self-pay

## 2016-03-16 ENCOUNTER — Ambulatory Visit: Payer: Self-pay

## 2016-03-16 ENCOUNTER — Other Ambulatory Visit: Payer: Self-pay

## 2016-03-27 ENCOUNTER — Other Ambulatory Visit: Payer: Self-pay

## 2016-03-27 ENCOUNTER — Ambulatory Visit: Payer: Self-pay

## 2016-04-05 ENCOUNTER — Ambulatory Visit
Admission: RE | Admit: 2016-04-05 | Discharge: 2016-04-05 | Disposition: A | Discharge status: Home / Self Care | Source: Ambulatory Visit | Attending: Physician Assistant | Admitting: Physician Assistant

## 2016-04-05 DIAGNOSIS — E2839 Other primary ovarian failure: Secondary | ICD-10-CM

## 2016-04-05 DIAGNOSIS — Z0000 Encounter for general adult medical examination without abnormal findings: Secondary | ICD-10-CM

## 2016-04-05 DIAGNOSIS — Z780 Asymptomatic menopausal state: Secondary | ICD-10-CM

## 2016-04-05 DIAGNOSIS — Z1231 Encounter for screening mammogram for malignant neoplasm of breast: Secondary | ICD-10-CM

## 2016-04-13 ENCOUNTER — Telehealth: Payer: Self-pay

## 2016-04-13 DIAGNOSIS — M8580 Other specified disorders of bone density and structure, unspecified site: Secondary | ICD-10-CM | POA: Insufficient documentation

## 2016-04-13 MED ORDER — CALCIUM 1200 1200-1000 MG-UNIT PO CHEW: 1000.0000 mg | CHEWABLE_TABLET | Freq: Every day | ORAL | 0 refills | Status: DC

## 2016-04-13 MED ORDER — CHOLECALCIFEROL 100 MCG (4000 UT) PO CAPS: 1.0000 | ORAL_CAPSULE | Freq: Every day | ORAL | 0 refills | Status: DC

## 2016-04-13 NOTE — Telephone Encounter (Signed)
New rx sent in

## 2016-04-18 ENCOUNTER — Other Ambulatory Visit: Payer: Self-pay | Admitting: Physician Assistant

## 2016-04-18 DIAGNOSIS — N6489 Other specified disorders of breast: Secondary | ICD-10-CM

## 2016-04-20 NOTE — Telephone Encounter (Signed)
error 

## 2016-04-25 ENCOUNTER — Ambulatory Visit
Admission: RE | Admit: 2016-04-25 | Discharge: 2016-04-25 | Disposition: A | Discharge status: Home / Self Care | Source: Ambulatory Visit | Attending: Physician Assistant | Admitting: Physician Assistant

## 2016-04-25 DIAGNOSIS — N6489 Other specified disorders of breast: Secondary | ICD-10-CM

## 2016-07-04 ENCOUNTER — Other Ambulatory Visit: Payer: Self-pay

## 2016-07-04 NOTE — Telephone Encounter (Signed)
Last Ov 02-17-2016 Last refill 11-03-2015 Okay to refill  Estrogen-methyltest?

## 2016-07-05 MED ORDER — EST ESTROGENS-METHYLTEST 1.25-2.5 MG PO TABS: 1.0000 | ORAL_TABLET | Freq: Every day | ORAL | 2 refills | Status: DC

## 2016-07-05 MED ORDER — CHOLECALCIFEROL 100 MCG (4000 UT) PO CAPS: 1.0000 | ORAL_CAPSULE | Freq: Every day | ORAL | 1 refills | Status: DC

## 2016-07-05 MED ORDER — ACYCLOVIR 400 MG PO TABS: 400.0000 mg | ORAL_TABLET | Freq: Three times a day (TID) | ORAL | 1 refills | Status: DC | PRN

## 2016-07-05 MED ORDER — OMEPRAZOLE 20 MG PO CPDR: DELAYED_RELEASE_CAPSULE | ORAL | 1 refills | Status: DC

## 2016-07-05 MED ORDER — EST ESTROGENS-METHYLTEST 1.25-2.5 MG PO TABS: 1.0000 | ORAL_TABLET | Freq: Every day | ORAL | 1 refills | Status: DC

## 2016-07-05 MED ORDER — CALCIUM 1200 1200-1000 MG-UNIT PO CHEW: 1000.0000 mg | CHEWABLE_TABLET | Freq: Every day | ORAL | 1 refills | Status: DC

## 2016-07-05 MED ORDER — EST ESTROGENS-METHYLTEST 1.25-2.5 MG PO TABS: 1.0000 | ORAL_TABLET | Freq: Every day | ORAL | 0 refills | Status: DC

## 2016-07-05 NOTE — Telephone Encounter (Signed)
Approved to Give refills to last through June.

## 2016-07-06 NOTE — Telephone Encounter (Signed)
Medications sent to pharmacy as requested by the pt E[facility] for 1 month Prilosec, Acyclovir and [facility]est [facility]  Pt is aware

## 2016-07-12 ENCOUNTER — Telehealth: Payer: Self-pay

## 2016-07-12 MED ORDER — EST ESTROGENS-METHYLTEST DS 1.25-2.5 MG PO TABS: 1.0000 | ORAL_TABLET | Freq: Every day | ORAL | 1 refills | Status: DC

## 2016-07-12 NOTE — Telephone Encounter (Signed)
[facility] Estratest 1.25/2.5 tabs has been discontinued  Rx sent for Altern ESTRO/METH 1.25/2.5 tabs

## 2016-07-24 ENCOUNTER — Encounter: Payer: Self-pay | Admitting: Physician Assistant

## 2016-09-22 ENCOUNTER — Ambulatory Visit: Admitting: Family Medicine

## 2016-09-28 ENCOUNTER — Encounter: Payer: Self-pay | Admitting: Physician Assistant

## 2016-09-28 ENCOUNTER — Ambulatory Visit (INDEPENDENT_AMBULATORY_CARE_PROVIDER_SITE_OTHER): Admitting: Physician Assistant

## 2016-09-28 VITALS — BP 110/78 | HR 73 | Temp 98.3°F | Resp 16 | Wt 144.0 lb

## 2016-09-28 DIAGNOSIS — M25562 Pain in left knee: Secondary | ICD-10-CM

## 2016-09-28 NOTE — Progress Notes (Signed)
Patient ID: Latasha Martinez MRN: [id number], DOB: 22-Apr-1961, 56 y.o. Date of Encounter: 09/28/2016, 2:01 PM    Chief Complaint:  Chief Complaint  Patient presents with  . discuss MRI     HPI: 56 y.o. year old female here with above.   She reports to me that Dr. Laurian Martinez at the pain clinic ordered an MRI. However says that she had not gotten those results yet so she called Dr. Jon Martinez office but they said they had not gotten the MRI report yet. She says that she went to [facility] herself and got a copy of the MRI report and then made appointment with me to discuss/follow-up.  [name] says that 2 weeks ago her left knee locked up on her. Since then she has been having increased pain and has been having to sleep in a chair. Says she went to the ER about it and they did an x-ray but would not do any further tests there. When she had her appointment with Dr. Laurian Martinez she got him to order the MRI.  Says that she cannot bend her left knee. Says that she cannot bear weight on the left leg. Says that if she sits and has her leg down like she is right now during this visit-- that after that she will have significant increased pain.  She reports that she had surgery to the left knee 10 years ago in [place]. Has seen no orthopedic locally.     Home Meds:   Outpatient Medications Prior to Visit  Medication Sig Dispense Refill  . acyclovir (ZOVIRAX) 400 MG tablet Take 1 tablet (400 mg total) by mouth 3 (three) times daily as needed. 90 tablet 1  . baclofen (LIORESAL) 10 MG tablet Take 1 tablet by mouth 3 (three) times daily as needed.    . Calcium Carbonate-Vit D-Min (CALCIUM 1200) 1200-1000 MG-UNIT CHEW Chew 1,000 mg by mouth daily. 90 each 1  . Cholecalciferol 4000 units CAPS Take 1 capsule (4,000 Units total) by mouth daily. 90 capsule 1  . EST ESTROGENS-METHYLTEST DS 1.25-2.5 MG TABS Take 1 tablet by mouth daily. 60 each 1  . omeprazole (PRILOSEC) 20 MG capsule TAKE 1 CAPSULE BY MOUTH  EVERY DAY 90 capsule 1  . oxyCODONE-acetaminophen (PERCOCET) 10-325 MG tablet Take 1 tablet by mouth every 6 (six) hours as needed for pain. 48 tablet 0  . OXYCONTIN 10 MG 12 hr tablet Take 1 tablet (10 mg total) by mouth 2 (two) times daily. 24 tablet 0  . polyethylene glycol (MIRALAX / GLYCOLAX) packet Take 17 g by mouth daily. 14 each 3  . topiramate (TOPAMAX) 25 MG tablet Take 1 tablet by mouth at bedtime.    . Vitamin D, Ergocalciferol, (DRISDOL) 50000 UNITS CAPS capsule Take 1 capsule (50,000 Units total) by mouth every 7 (seven) days. One tablet a week x 12 weeks, then start vitamin D3 4000 IU daily, over the counter. 12 capsule 0   No facility-administered medications prior to visit.     Allergies:  Allergies  Allergen Reactions  . Demerol [Meperidine] Other (See Comments)    Hallucinations  . Morphine And Related Itching and Rash  . Sulfa Antibiotics Rash      Review of Systems: See HPI for pertinent ROS. All other ROS negative.    Physical Exam: Blood pressure 110/78, pulse 73, temperature 98.3 F (36.8 C), temperature source Oral, resp. rate 16, weight 144 lb (65.3 kg), SpO2 98 %., Body mass index is 23.24 kg/m. General: WNWD WF.  Appears in no acute distress. Neck: Supple. No thyromegaly. No lymphadenopathy. Lungs: Clear bilaterally to auscultation without wheezes, rales, or rhonchi. Breathing is unlabored. Heart: Regular rhythm. No murmurs, rubs, or gallops. Msk:  Strength and tone normal for age. Extremities/Skin: Warm and dry.  Neuro: Alert and oriented X 3. Moves all extremities spontaneously. Gait is normal. CNII-XII grossly in tact. Psych:  Responds to questions appropriately with a normal affect.     ASSESSMENT AND PLAN:  56 y.o. year old female with  1. Acute pain of left knee I am placing order for referral to orthopedic surgery and have marked this as urgent. I have discussed the case with referral coordinator who will schedule follow-up with  orthopedics. Have also given her a copy of the MRI report to fax to orthopedic. Pt has set of crutches with her today. She is to use these and avoid weight bearing until f/u with Ortho. I have reviewed MRI report---it shows no acute findings---it shows plate, screws, hardware.  - Ambulatory referral to Orthopedic Surgery   Signed, Shon Hale [place], [place], [facility] 09/28/2016 2:01 PM

## 2016-09-29 ENCOUNTER — Other Ambulatory Visit: Payer: Self-pay

## 2016-09-29 DIAGNOSIS — E559 Vitamin D deficiency, unspecified: Secondary | ICD-10-CM

## 2016-09-29 MED ORDER — POLYETHYLENE GLYCOL 3350 17 G PO PACK: 17.0000 g | PACK | Freq: Every day | ORAL | 3 refills | Status: AC

## 2016-09-29 MED ORDER — OMEPRAZOLE 20 MG PO CPDR: DELAYED_RELEASE_CAPSULE | ORAL | 1 refills | Status: DC

## 2016-09-29 MED ORDER — VITAMIN D (ERGOCALCIFEROL) 1.25 MG (50000 UNIT) PO CAPS: 50000.0000 [IU] | ORAL_CAPSULE | ORAL | 0 refills | Status: DC

## 2016-09-29 MED ORDER — CHOLECALCIFEROL 100 MCG (4000 UT) PO CAPS
1.0000 | ORAL_CAPSULE | Freq: Every day | ORAL | 1 refills | Status: AC
Start: 2016-09-29 — End: ?

## 2016-09-29 MED ORDER — EST ESTROGENS-METHYLTEST DS 1.25-2.5 MG PO TABS: 1.0000 | ORAL_TABLET | Freq: Every day | ORAL | 1 refills | Status: DC

## 2016-09-29 MED ORDER — TOPIRAMATE 25 MG PO TABS: 25.0000 mg | ORAL_TABLET | Freq: Every day | ORAL | 1 refills | Status: AC

## 2016-09-29 MED ORDER — ACYCLOVIR 400 MG PO TABS: 400.0000 mg | ORAL_TABLET | Freq: Three times a day (TID) | ORAL | 1 refills | Status: DC | PRN

## 2016-09-29 MED ORDER — CALCIUM 1200 1200-1000 MG-UNIT PO CHEW: 1000.0000 mg | CHEWABLE_TABLET | Freq: Every day | ORAL | 1 refills | Status: AC

## 2016-09-29 NOTE — Telephone Encounter (Signed)
Patient was seen in the office on 4/19 and requested refills on her medications. Medications sent to pharmacy

## 2016-10-02 ENCOUNTER — Ambulatory Visit (INDEPENDENT_AMBULATORY_CARE_PROVIDER_SITE_OTHER)

## 2016-10-02 ENCOUNTER — Ambulatory Visit (INDEPENDENT_AMBULATORY_CARE_PROVIDER_SITE_OTHER): Admitting: Orthopaedic Surgery

## 2016-10-02 ENCOUNTER — Encounter (INDEPENDENT_AMBULATORY_CARE_PROVIDER_SITE_OTHER): Payer: Self-pay | Admitting: Orthopaedic Surgery

## 2016-10-02 VITALS — BP 119/75 | HR 78 | Ht 67.0 in | Wt 135.0 lb

## 2016-10-02 DIAGNOSIS — M25562 Pain in left knee: Secondary | ICD-10-CM | POA: Diagnosis not present

## 2016-10-02 MED ORDER — BUPIVACAINE HCL 0.25 % IJ SOLN
0.6600 mL | INTRAMUSCULAR | Status: AC | PRN
  Administered 2016-10-02: .66 mL via INTRA_ARTICULAR

## 2016-10-02 MED ORDER — LIDOCAINE HCL 1 % IJ SOLN
1.0000 mL | INTRAMUSCULAR | Status: AC | PRN
  Administered 2016-10-02: 1 mL

## 2016-10-02 MED ORDER — METHYLPREDNISOLONE ACETATE 40 MG/ML IJ SUSP
40.0000 mg | INTRAMUSCULAR | Status: AC | PRN
  Administered 2016-10-02: 40 mg via INTRA_ARTICULAR

## 2016-10-02 NOTE — Progress Notes (Signed)
Office Visit Note   Patient: Latasha Martinez           Date of Birth: [birth year]           MRN: [id number] Visit Date: 10/02/2016              Requested by: Dorena Bodo, PA-C [address] [place] [address] PCP: Frazier Richards, PA-C   Assessment & Plan: Visit Diagnoses:  1. Acute pain of left knee   2.     Chronic opioid prescription usage since 2008 3.    Previous left tibia fracture 2008 Plan: Intra-articular injection performed in the left knee. I included some Marcaine post injection she thinks it's a little bit better but still cannot  walk without her crutches. I'll recheck her in 3 weeks.   Follow-Up Instructions: Return in about 3 weeks (around 10/23/2016).   Orders:  Orders Placed This Encounter  Procedures  . Large Joint Injection/Arthrocentesis  . XR Tibia/Fibula Left  . XR Knee 1-2 Views Left   No orders of the defined types were placed in this encounter.     Procedures: Large Joint Inj Date/Time: 10/02/2016 9:17 AM Performed by: Eldred Manges Authorized by: Eldred Manges   Consent Given by:  Patient Indications:  Pain and joint swelling Location:  Knee Site:  L knee Needle Size:  22 G Needle Length:  1.5 inches Approach:  Anterolateral Ultrasound Guidance: No   Fluoroscopic Guidance: No   Arthrogram: No   Medications:  1 mL lidocaine 1 %; 40 mg methylPREDNISolone acetate 40 MG/ML; 0.66 mL bupivacaine 0.25 % Aspiration Attempted: No   Patient tolerance:  Patient tolerated the procedure well with no immediate complications     Clinical Data: No additional findings.   Subjective: Chief Complaint  Patient presents with  . Left Knee - Pain    HPI patient is here for the first time had the onset of pain 3 weeks ago when she was in the kitchen and turned and states she had excruciating pain and states that since that time she's had to be on crutches that she borrowed from her daughter. She states her knee feels like it catches and  locks. She is able to put a little bit of weight on past few days. 2008: History were she was kicked by a horse and had surgery at [facility] October 2008 with medial tibial plateau plate that extends down to the midshaft of the tibia and has completely healed. Patient followed by once the [facility] pain specialist and has been on as much as 80 mg of OxyContin twice a day in the past. Currently she is on oxycodone 10/325 4 tablets a day and just started some  Xtampza ER 13.5 mg q 12 hrs. . 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 Her youngest daughter is with her today she states she has 7 children. Patient denies any associated bowel or bladder symptoms. Currently the knee pain she rates as 10 out of 10.  Review of Systems  Constitutional: Negative for chills and diaphoresis.  HENT: Negative for ear discharge, ear pain and nosebleeds.   Eyes: Negative for discharge and visual disturbance.  Respiratory: Negative for cough, choking and shortness of breath.   Cardiovascular: Negative for chest pain and palpitations.  Gastrointestinal: Negative for abdominal distention and abdominal pain.  Endocrine: Negative for cold intolerance and heat intolerance.  Genitourinary: Negative for flank pain and hematuria.  Musculoskeletal:  Positive for previous left medial tibial plateau fracture 2008. Chronic pain since her fracture. 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 She is followed at [facility] pain specialist. Patient states her knee catches feels like it locks points medial joint line where she's having pain. 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  Skin: Negative for rash and wound.  Neurological: Negative for seizures and speech difficulty.    Hematological: Negative for adenopathy. Does not bruise/bleed easily.  Psychiatric/Behavioral: Negative for agitation and suicidal ideas.   hysterectomy 2003, positive for migraine symptoms disease history of acid reflux. Positive for chronic pain medication since 2008.   Objective: Vital Signs: BP 119/75   Pulse 78   Ht  (1.702 m)   Wt 135 lb (61.2 kg)   BMI 21.14 kg/m   Physical Exam  Constitutional: She is oriented to person, place, and time. She appears well-developed.  HENT:  Head: Normocephalic.  Right Ear: External ear normal.  Left Ear: External ear normal.  Eyes: Pupils are equal, round, and reactive to light.  Neck: No tracheal deviation present. No thyromegaly present.  Cardiovascular: Normal rate.   Pulmonary/Chest: Effort normal.  Abdominal: Soft.  Musculoskeletal:  Well-healed medial S shaped incision along the medial tibial plateau. She may have trace knee effusion. She is extremely slow with flexion-extension of her knee and it takes 30 seconds to get from full extension to flexion to 100 for her. She has exquisite tenderness along the medial joint line and also over the pes bursa. She jumps with palpation about her knee. No pain with hip range of motion. No pitting edema pulses are intact good capillary refill.  Neurological: She is alert and oriented to person, place, and time.  Skin: Skin is warm and dry.  Psychiatric: She has a normal mood and affect. Her behavior is normal.    Ortho Exam EHL intertendinous intact. She has bilateral mild quad atrophy.  Specialty Comments:  No specialty comments available.  Imaging: Xr Knee 1-2 Views Left  Result Date: 10/02/2016 Standing AP both knees lateral left knee demonstrates trace varus deformity in comparison to the opposite knee. Joint line is well-maintained no significant degenerative changes are seen in the knee. Previously placed medial tibial plate with complete healing of the fracture. Impression: No  significant degenerative x-rays changes of arthritis. Previous tibial fracture with medial plate.  Xr Tibia/fibula Left  Result Date: 10/02/2016 AP lateral left tib-fib x-rays demonstrate the previous comminuted tibial fracture with remodeling in the complete healing. There is a medial plate that extends down to the mid tibial level. No significant joint space narrowing. Impression postoperative changes from previous tibial fracture with medial tibial plate. Actually is completely healed.    PMFS History: Patient Active Problem List   Diagnosis Date Noted  . Osteopenia 04/13/2016  . 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 Vitamin D deficiency 08/06/2014  . Smoker 07/22/2014  . GERD (gastroesophageal reflux disease)   . Chronic pain    Past Medical History:  Diagnosis Date  . Chronic pain   . GERD (gastroesophageal reflux disease)   . Ovarian remnant syndrome 2004    Family History  Problem Relation Age of Onset  . Miscarriages / Stillbirths Maternal Grandmother   . Alzheimer's disease  Maternal Grandmother   . Arthritis Maternal Grandfather   . Hyperlipidemia Maternal Grandfather   . Hypertension Maternal Grandfather   . Stroke Maternal Grandfather   . Arthritis Paternal Grandmother   . Stroke Paternal Grandmother   . Hearing loss Paternal Grandfather   . Hypertension Paternal Grandfather   . Stroke Paternal Grandfather     Past Surgical History:  Procedure Laterality Date  . ABDOMINAL HYSTERECTOMY  2000  . FRACTURE SURGERY  2008   left leg kicked by horse  . TUMOR REMOVAL  2004   ovarian remnant syndrome   Social History   Occupational History  . Not on file.   Social History Main Topics  . Smoking status: Current Every Day Smoker    Packs/day: 0.50    Years: 35.00    Types: Cigarettes  . Smokeless tobacco: Never Used  . Alcohol use No  . Drug use: No  . Sexual activity: Yes    Birth control/ protection: Surgical

## 2016-10-05 ENCOUNTER — Ambulatory Visit (INDEPENDENT_AMBULATORY_CARE_PROVIDER_SITE_OTHER): Admitting: Orthopaedic Surgery

## 2016-10-10 ENCOUNTER — Encounter: Payer: Self-pay | Admitting: Physician Assistant

## 2016-10-26 ENCOUNTER — Ambulatory Visit (INDEPENDENT_AMBULATORY_CARE_PROVIDER_SITE_OTHER): Admitting: Orthopaedic Surgery

## 2016-11-02 ENCOUNTER — Ambulatory Visit (INDEPENDENT_AMBULATORY_CARE_PROVIDER_SITE_OTHER): Admitting: Orthopaedic Surgery

## 2016-11-09 ENCOUNTER — Ambulatory Visit (INDEPENDENT_AMBULATORY_CARE_PROVIDER_SITE_OTHER): Admitting: Orthopaedic Surgery

## 2016-11-30 ENCOUNTER — Encounter: Payer: Self-pay | Admitting: Physician Assistant

## 2016-12-25 ENCOUNTER — Other Ambulatory Visit: Payer: Self-pay | Admitting: Physician Assistant

## 2016-12-25 NOTE — Telephone Encounter (Signed)
ok 

## 2016-12-25 NOTE — Telephone Encounter (Signed)
Last OV 09/28/2016 Last refill 09/29/2016 Ok to refill?

## 2016-12-26 NOTE — Telephone Encounter (Signed)
Rx called in to pharmacy. 

## 2017-01-01 ENCOUNTER — Telehealth: Payer: Self-pay | Admitting: Physician Assistant

## 2017-01-01 NOTE — Telephone Encounter (Signed)
I tried calling pt to discuss I don't see where insurance has overpaid. The number has been changed or no longer in service.

## 2017-01-01 NOTE — Telephone Encounter (Signed)
-----   Message from Karlton Lemoniffany A Johnson, [place] sent at 12/26/2016 10:55 AM EDT ----- Regarding: Billing Please call patient regarding her balance. Patient states she was informed buy her insurance not to pay because they overpaid one of her visits.

## 2017-02-07 ENCOUNTER — Encounter: Payer: Self-pay | Admitting: Physician Assistant

## 2017-04-23 ENCOUNTER — Other Ambulatory Visit: Payer: Self-pay

## 2017-04-23 ENCOUNTER — Encounter: Payer: Self-pay | Admitting: Physician Assistant

## 2017-04-23 ENCOUNTER — Ambulatory Visit (INDEPENDENT_AMBULATORY_CARE_PROVIDER_SITE_OTHER): Admitting: Physician Assistant

## 2017-04-23 VITALS — BP 110/78 | HR 80 | Temp 98.0°F | Resp 14 | Ht 67.0 in | Wt 146.6 lb

## 2017-04-23 DIAGNOSIS — G8921 Chronic pain due to trauma: Secondary | ICD-10-CM

## 2017-04-23 DIAGNOSIS — E559 Vitamin D deficiency, unspecified: Secondary | ICD-10-CM

## 2017-04-23 DIAGNOSIS — F17200 Nicotine dependence, unspecified, uncomplicated: Secondary | ICD-10-CM

## 2017-04-23 DIAGNOSIS — Z1159 Encounter for screening for other viral diseases: Secondary | ICD-10-CM | POA: Diagnosis not present

## 2017-04-23 DIAGNOSIS — M8580 Other specified disorders of bone density and structure, unspecified site: Secondary | ICD-10-CM | POA: Diagnosis not present

## 2017-04-23 DIAGNOSIS — Z114 Encounter for screening for human immunodeficiency virus [HIV]: Secondary | ICD-10-CM

## 2017-04-23 DIAGNOSIS — K219 Gastro-esophageal reflux disease without esophagitis: Secondary | ICD-10-CM | POA: Diagnosis not present

## 2017-04-23 MED ORDER — EST ESTROGENS-METHYLTEST DS 1.25-2.5 MG PO TABS: 1.0000 | ORAL_TABLET | Freq: Every day | ORAL | 11 refills | Status: DC

## 2017-04-23 NOTE — Progress Notes (Signed)
Patient ID: Pierre Cumpton MRN: [id number], DOB: 04/22/1961, 56 y.o. Date of Encounter: @DATE @  Chief Complaint:  Chief Complaint  Patient presents with  . Medication Refill    HPI: 56 y.o. year old white female     07/22/2014: presents as new patient to establish care.  She states that she just recently moved here from [place]. However only was in [place] for about one year. Prior to that she was in [place]. "Had been in [place] forever ".  Says that while she was living in [place] she just continue to go back to her prior PCP in [place].    She had an injury from a horse accident. Has scars on her left leg from the surgery. She says that there is a metal plate there with lots of pins in her leg still. Says that it was her PCP in [place] who has been prescribing her pain medications. Never been seen in  a pain clinic. Says that that PCP gradually had to increase the dose. She was on Oxy 40 mg for couple of years then gradually went up to 60mg  for a while and then up to 80 mg now.  Says that she last saw gynecologist around 2008. Since then she was just seeing her PCP for GYN care. Asked about her Estratest and she says that she was put on some type of hormone starting around 1995.  Says now she remembers that she was switched to the Estratest in the year 2000.  Says at that time she was 56 years old and was divorced but was in a new relationship and complained of decreased libido so was prescribed Estratest. Has continued on this ever since.  She says that April 26 2014 she had to have all of her teeth removed and dentures placed.  Regarding smoking she says that in the past she was prescribed Wellbutrin. When she was on Wellbutrin she could hardly get out of bed. Says that she didn't care about anything. Says that recently she has been using "Vap" -- however finds that after eating dinner she has to have a real cigarette.  Says that her last pelvic exam and mammogram  were over 1 year ago. She is not fasting today.   Subsequently, she had CPE with me 08/03/2014, 11/03/2015.   04/23/2017: We discussed that she is still going to pain clinic.  She reports that she still goes to the same location but that Dr. Laurian Brim had left there--says "now he is just doing surgery."   Says that the name also changed and is called [facility].  It is located in [place].  Says that she sees Hyman Hopes, NP when she goes there.  The pain clinic prescribes the following medications:  Xtampza, Topamax, Percocet, baclofen.  Reports that she takes the omeprazole every day.  Says that this was prescribed to her years ago and she thought she was supposed to take it daily so has just continued to take it and has not tried to go without it.  She is having no symptoms of heartburn or acid reflux.  Today I have told her to try to go without the medication and to use only as needed.  She continues to take the Estratest.  This continues to work well for her and is causing no adverse effects.  She reports that she is taking vitamin D supplement as directed.  Also takes another calcium plus D supplement that contains both calcium and vitamin D in  addition to the other plain vitamin D supplement.  Reports that she continues to smoke about 1 pack/day.  She has no specific concerns to address today.     Past Medical History:  Diagnosis Date  . Chronic pain   . GERD (gastroesophageal reflux disease)   . Ovarian remnant syndrome 2004     Home Meds: Outpatient Medications Prior to Visit  Medication Sig Dispense Refill  . acyclovir (ZOVIRAX) 400 MG tablet Take 1 tablet (400 mg total) by mouth 3 (three) times daily as needed. 90 tablet 1  . baclofen (LIORESAL) 10 MG tablet Take 1 tablet by mouth 3 (three) times daily as needed.    . Calcium Carbonate-Vit D-Min (CALCIUM 1200) 1200-1000 MG-UNIT CHEW Chew 1,000 mg by mouth daily. 90 each 1  . Cholecalciferol 4000 units  CAPS Take 1 capsule (4,000 Units total) by mouth daily. 90 capsule 1  . omeprazole (PRILOSEC) 20 MG capsule TAKE 1 CAPSULE BY MOUTH EVERY DAY 90 capsule 1  . oxyCODONE-acetaminophen (PERCOCET) 10-325 MG tablet Take 1 tablet by mouth every 6 (six) hours as needed for pain. 48 tablet 0  . polyethylene glycol (MIRALAX / GLYCOLAX) packet Take 17 g by mouth daily. 14 each 3  . topiramate (TOPAMAX) 25 MG tablet Take 1 tablet (25 mg total) by mouth at bedtime. 90 tablet 1  . XTAMPZA ER 13.5 MG C12A Take 13.5 mg by mouth 2 (two) times daily.    Marland Kitchen. EST ESTROGENS-METHYLTEST DS 1.25-2.5 MG TABS TAKE 1 TABLET BY MOUTH EVERY DAY 30 each 0  . OXYCONTIN 10 MG 12 hr tablet Take 1 tablet (10 mg total) by mouth 2 (two) times daily. (Patient not taking: Reported on 10/02/2016) 24 tablet 0  . Vitamin D, Ergocalciferol, (DRISDOL) 50000 units CAPS capsule Take 1 capsule (50,000 Units total) by mouth every 7 (seven) days. One tablet a week x 12 weeks, then start vitamin D3 4000 IU daily, over the counter. 12 capsule 0   No facility-administered medications prior to visit.     Allergies:  Allergies  Allergen Reactions  . Demerol [Meperidine] Other (See Comments)    Hallucinations  . Morphine And Related Itching and Rash  . Sulfa Antibiotics Rash    Social History   Socioeconomic History  . Marital status: Married    Spouse name: Not on file  . Number of children: Not on file  . Years of education: Not on file  . Highest education level: Not on file  Social Needs  . Financial resource strain: Not on file  . Food insecurity - worry: Not on file  . Food insecurity - inability: Not on file  . Transportation needs - medical: Not on file  . Transportation needs - non-medical: Not on file  Occupational History  . Not on file  Tobacco Use  . Smoking status: Current Every Day Smoker    Packs/day: 0.50    Years: 35.00    Pack years: 17.50    Types: Cigarettes  . Smokeless tobacco: Never Used  Substance and  Sexual Activity  . Alcohol use: No  . Drug use: No  . Sexual activity: Yes    Birth control/protection: Surgical  Other Topics Concern  . 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    When she has to leave for appointments etc.  she has to arrange for other family to care for him.    Family History  Problem Relation Age of Onset  . Miscarriages / Stillbirths Maternal Grandmother   . Alzheimer's disease Maternal Grandmother   . Arthritis Maternal Grandfather   . Hyperlipidemia Maternal Grandfather   . Hypertension Maternal Grandfather   . Stroke Maternal Grandfather   . Arthritis Paternal Grandmother   . Stroke Paternal Grandmother   . Hearing loss Paternal Grandfather   . Hypertension Paternal Grandfather   . Stroke Paternal Grandfather      Review of Systems:  See HPI for pertinent ROS. All other ROS negative.    Physical Exam: Blood pressure 110/78, pulse 80, temperature 98 F (36.7 C), temperature source Oral, resp. rate 14, height 5\' 7"  (1.702 m), weight 66.5 kg (146 lb 9.6 oz), SpO2 99 %., Body mass index is 22.96 kg/m. General: WNWD WF. Appears in no acute distress. Neck: Supple. No thyromegaly. No lymphadenopathy. No carotid bruits. Lungs: Clear bilaterally to auscultation without wheezes, rales, or rhonchi. Breathing is unlabored. Heart: RRR with S1 S2. No murmurs, rubs, or gallops. Abdomen: Soft, non-tender, non-distended with normoactive bowel sounds. No hepatomegaly. No rebound/guarding. No obvious abdominal masses. Musculoskeletal:  Strength and tone normal for age. Scar down anterior aspect of left lower leg.  Extremities/Skin: Warm and dry.  No edema.  Neuro: Alert and oriented X 3. Moves all extremities spontaneously. Gait is normal. CNII-XII grossly in tact. Psych:  Responds to questions appropriately with a normal affect.      ASSESSMENT AND PLAN:  56 y.o. year old female with   Gastroesophageal reflux disease, esophagitis presence not specified 04/23/2017: To stop taking the omeprazole daily and just use this if needed.  Chronic pain 04/23/2017: Managed by the pain clinic.  Smoker 04/23/2017: Adverse effects with Wellbutrin in the past.  Given her chronic pain treatment will avoid Chantix.  Sure she has developed some COPD but she is asymptomatic so we will hold off on        spirometry/treatment  Osteopenia, unspecified location 04/23/2017: Bone density scan 04/05/16.  T score was -1.3.  Consistent with osteopenia but this would be very mild osteopenia.  Since then she has been treated with calcium as well as vitamin D.  Also in the           past her vitamin D level was extremely low and she has been on additional vitamin D since then.  Recheck vitamin D level now.   - VITAMIN D 25 Hydroxy (Vit-D Deficiency, Fractures)  Vitamin D deficiency 04/23/2017--- Vit D was last checked 10/2015 and was very low at 15.  She has been on increased dose of supplement since then.  Recheck vitamin D level now. - VITAMIN D 25 Hydroxy (Vit-D Deficiency, Fractures)  Screening for HIV (human immunodeficiency virus) 04/23/2017:  - HIV antibody  Need for hepatitis C screening test 04/23/2017: - Hepatitis C antibody  Preventive care see her last CPE note which was 11/03/15. At next visit we will discuss having her schedule another CPE.  Today I did discuss flu vaccine but she deferred.  Signed, [address]Mary Beth Concord[place]ixon, [place], [facility] 04/23/2017 11:08 AM

## 2017-04-24 LAB — HIV ANTIBODY (ROUTINE TESTING W REFLEX): HIV 1&2 Ab, 4th Generation: NONREACTIVE

## 2017-04-24 LAB — VITAMIN D 25 HYDROXY (VIT D DEFICIENCY, FRACTURES): VIT D 25 HYDROXY: 28 ng/mL — AB (ref 30–100)

## 2017-04-24 LAB — HEPATITIS C ANTIBODY
HEP C AB: NONREACTIVE
SIGNAL TO CUT-OFF: 0.01 (ref <1.00)

## 2017-04-27 ENCOUNTER — Other Ambulatory Visit: Payer: Self-pay | Admitting: Physician Assistant

## 2017-04-27 DIAGNOSIS — Z1231 Encounter for screening mammogram for malignant neoplasm of breast: Secondary | ICD-10-CM

## 2017-06-18 ENCOUNTER — Ambulatory Visit

## 2017-06-25 ENCOUNTER — Ambulatory Visit

## 2017-08-22 ENCOUNTER — Encounter: Admitting: Physician Assistant

## 2017-09-05 ENCOUNTER — Other Ambulatory Visit: Payer: Self-pay

## 2017-09-05 ENCOUNTER — Encounter: Payer: Self-pay | Admitting: Physician Assistant

## 2017-09-05 ENCOUNTER — Ambulatory Visit (INDEPENDENT_AMBULATORY_CARE_PROVIDER_SITE_OTHER): Admitting: Physician Assistant

## 2017-09-05 VITALS — BP 110/72 | HR 86 | Temp 98.4°F | Resp 16 | Ht 67.5 in | Wt 144.6 lb

## 2017-09-05 DIAGNOSIS — F17200 Nicotine dependence, unspecified, uncomplicated: Secondary | ICD-10-CM

## 2017-09-05 DIAGNOSIS — Z0000 Encounter for general adult medical examination without abnormal findings: Secondary | ICD-10-CM

## 2017-09-05 DIAGNOSIS — M8580 Other specified disorders of bone density and structure, unspecified site: Secondary | ICD-10-CM | POA: Diagnosis not present

## 2017-09-05 DIAGNOSIS — G894 Chronic pain syndrome: Secondary | ICD-10-CM

## 2017-09-05 DIAGNOSIS — K219 Gastro-esophageal reflux disease without esophagitis: Secondary | ICD-10-CM

## 2017-09-05 DIAGNOSIS — E559 Vitamin D deficiency, unspecified: Secondary | ICD-10-CM | POA: Diagnosis not present

## 2017-09-05 MED ORDER — VARENICLINE TARTRATE 0.5 MG X 11 & 1 MG X 42 PO MISC: ORAL | 0 refills | Status: DC
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
## 2017-09-07 ENCOUNTER — Ambulatory Visit

## 2017-10-15 ENCOUNTER — Other Ambulatory Visit: Payer: Self-pay

## 2017-10-15 MED ORDER — ACYCLOVIR 400 MG PO TABS: 400.0000 mg | ORAL_TABLET | Freq: Three times a day (TID) | ORAL | 1 refills | Status: AC | PRN

## 2017-10-15 MED ORDER — EST ESTROGENS-METHYLTEST DS 1.25-2.5 MG PO TABS: 1.0000 | ORAL_TABLET | Freq: Every day | ORAL | 11 refills | Status: DC

## 2017-10-23 ENCOUNTER — Other Ambulatory Visit: Payer: Self-pay | Admitting: Physician Assistant

## 2017-11-27 ENCOUNTER — Telehealth: Payer: Self-pay

## 2017-11-27 NOTE — Telephone Encounter (Signed)
Received fax from [facility] that patient had not returned the sample.I called patient and she states she will send in the kit she keeps forgetting to get a sample but assures me she will get a sample and mail back in.

## 2018-03-06 ENCOUNTER — Other Ambulatory Visit (HOSPITAL_COMMUNITY): Payer: Self-pay | Admitting: Nurse Practitioner

## 2018-03-06 DIAGNOSIS — M545 Low back pain, unspecified: Secondary | ICD-10-CM

## 2018-03-22 ENCOUNTER — Ambulatory Visit (HOSPITAL_COMMUNITY)
Admission: RE | Admit: 2018-03-22 | Discharge: 2018-03-22 | Disposition: A | Discharge status: Home / Self Care | Source: Ambulatory Visit | Attending: Nurse Practitioner | Admitting: Nurse Practitioner

## 2018-03-26 ENCOUNTER — Ambulatory Visit (HOSPITAL_COMMUNITY)

## 2018-04-10 ENCOUNTER — Other Ambulatory Visit: Payer: Self-pay | Admitting: Physician Assistant

## 2018-04-10 NOTE — Telephone Encounter (Signed)
Call placed to patient the number on file is an automated recording.Not able to reach patient

## 2018-04-10 NOTE — Telephone Encounter (Signed)
This patient is on estrogen post menopausal and a smoker I dont see a mammogram in the past 2 years, and this medication increases risk of breast cancer Also appears she did not have her labs done at her physical in March If she is still on the estrogen - will Rx 30 day supply only, needs Fasting  OV to have these things addressed If she is not on estrogen then no prescription will be called in at this time

## 2018-04-10 NOTE — Telephone Encounter (Signed)
Last OV 09/05/2017 Last refill 10/15/2017 Ok to refill?

## 2018-04-12 ENCOUNTER — Encounter (HOSPITAL_COMMUNITY): Payer: Self-pay

## 2018-04-12 ENCOUNTER — Ambulatory Visit (HOSPITAL_COMMUNITY)

## 2018-04-24 ENCOUNTER — Other Ambulatory Visit: Payer: Self-pay | Admitting: Physician Assistant

## 2018-04-24 NOTE — Telephone Encounter (Signed)
Ok to refill??  Last office visit 09/05/2017.  Last refill 10/15/2017.

## 2018-05-17 ENCOUNTER — Telehealth: Payer: Self-pay | Admitting: *Deleted

## 2018-05-17 ENCOUNTER — Ambulatory Visit (INDEPENDENT_AMBULATORY_CARE_PROVIDER_SITE_OTHER): Admitting: Family Medicine

## 2018-05-17 ENCOUNTER — Encounter: Payer: Self-pay | Admitting: Family Medicine

## 2018-05-17 VITALS — BP 108/70 | HR 78 | Temp 98.2°F | Resp 15 | Ht 67.5 in | Wt 129.4 lb

## 2018-05-17 DIAGNOSIS — R319 Hematuria, unspecified: Secondary | ICD-10-CM | POA: Diagnosis not present

## 2018-05-17 DIAGNOSIS — F17200 Nicotine dependence, unspecified, uncomplicated: Secondary | ICD-10-CM

## 2018-05-17 DIAGNOSIS — N390 Urinary tract infection, site not specified: Secondary | ICD-10-CM

## 2018-05-17 LAB — URINALYSIS, ROUTINE W REFLEX MICROSCOPIC
Bilirubin Urine: NEGATIVE
Glucose, UA: NEGATIVE
Ketones, ur: NEGATIVE
Nitrite: NEGATIVE
Protein, ur: NEGATIVE
SPECIFIC GRAVITY, URINE: 1.01 (ref 1.001–1.03)
pH: 6 (ref 5.0–8.0)

## 2018-05-17 LAB — MICROSCOPIC MESSAGE

## 2018-05-17 MED ORDER — CEPHALEXIN 500 MG PO CAPS: 500.0000 mg | ORAL_CAPSULE | Freq: Four times a day (QID) | ORAL | 0 refills | Status: AC

## 2018-05-17 MED ORDER — VARENICLINE TARTRATE 0.5 MG X 11 & 1 MG X 42 PO MISC: ORAL | 0 refills | Status: DC

## 2018-05-17 MED ORDER — NICOTINE 14 MG/24HR TD PT24: 14.0000 mg | MEDICATED_PATCH | Freq: Every day | TRANSDERMAL | 0 refills | Status: DC

## 2018-05-17 MED ORDER — PHENAZOPYRIDINE HCL 200 MG PO TABS: 200.0000 mg | ORAL_TABLET | Freq: Three times a day (TID) | ORAL | 0 refills | Status: DC | PRN

## 2018-05-17 NOTE — Telephone Encounter (Signed)
I have prescribed a patch and put through to her pharmacy, we will have to see if this is covered.  We can call her and offer any discount cards that we have or mail it to her, there are no other ways to get her coverage or discounts.  She could call her insurance provider and asked them if they have any incentive programs or options.  We can always try Nicorette gum and patches to help with the withdrawal symptoms.  I am sorry which there was more we could do for this because its such an important thing that she is trying to do for her health and for her husband's health

## 2018-05-17 NOTE — Telephone Encounter (Signed)
Received fax requesting alternative to Chantix.   Medication is excluded from the patient's benefit. Drug is not covered by plan.  Please advise.

## 2018-05-17 NOTE — Progress Notes (Signed)
Patient ID: Latasha Martinez, female    DOB: November 20, 1960, 57 y.o.   MRN: [id number]  PCP: Dorena Bodo, PA-C  Chief Complaint  Patient presents with  . Urinary Tract Infection    onset tuesday. Patient states has had burning with urination,    Subjective:   Latasha Martinez is a 57 y.o. female, presents to clinic with CC of dysuria for 4 to 5 days that severely worsened this morning.  She endorses severe burning with urination, feels like she "cannot empty her bladder" she had malodorous urine, and backache although she does have chronic back pain and she is unsure if it is muscular or related to her urinary symptoms.  She states that she has been so busy at work for the past several weeks and she has barely been drinking any fluids.  The other day it was 9:30 PM before she first went to the bathroom that day.  She normally drinks juice, coffee or tea with dinner she has not been doing this, she drank a small Dr. Reino Kent and the next morning she had much worse symptoms she is unsure if is related to this.  She denies hematuria, abdominal pain, pelvic pain, nausea, vomiting, fever, chills, sweats, decreased appetite.  Her last UTI was about a year ago and prior to that was in her 96s or 30s.  She is never had any kidney stones or urological procedures.  She denies any vaginal symptoms.  She additionally inquires about getting a Chantix prescription to help her stop smoking her.  She reports her husband has been recently diagnosed with cancer.  She had previously discussed this with her PCP, Frazier Richards and has a Chantix prescription sitting at home but it is very old so she would like a new one.  She has no history of seizures or mood disorder.   Patient Active Problem List   Diagnosis Date Noted  . Osteopenia 04/13/2016  . 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 Vitamin D deficiency 08/06/2014  . Smoker 07/22/2014  . GERD (gastroesophageal reflux disease)   . Chronic pain      Prior to Admission  medications   Medication Sig Start Date End Date Taking? Authorizing Provider  acyclovir (ZOVIRAX) 400 MG tablet Take 1 tablet (400 mg total) by mouth 3 (three) times daily as needed. 10/15/17  Yes Dorena Bodo, PA-C  Calcium Carbonate-Vit D-Min (CALCIUM 1200) 1200-1000 MG-UNIT CHEW Chew 1,000 mg by mouth daily. 09/29/16  Yes Dorena Bodo, PA-C  Cholecalciferol 4000 units CAPS Take 1 capsule (4,000 Units total) by mouth daily. 09/29/16  Yes Dixon, Mary B, PA-C  Est Estrogens-Methyltest (EEMT) 1.25-2.5 MG TABS TAKE 1 TABLET BY MOUTH EVERY DAY 04/25/18  Yes Donita Brooks, MD  methocarbamol (ROBAXIN) 750 MG tablet Take 1,500 mg by mouth 3 (three) times daily. 04/15/18  Yes [provider]  omeprazole (PRILOSEC) 20 MG capsule TAKE 1 CAPSULE BY MOUTH EVERY DAY 09/29/16  Yes Dorena Bodo, PA-C  oxyCODONE-acetaminophen (PERCOCET) 10-325 MG tablet Take 1 tablet by mouth every 6 (six) hours as needed for pain. 02/17/16  Yes Dixon, Mary B, PA-C  OXYCONTIN 15 MG 12 hr tablet Take 15 mg by mouth 2 (two) times daily. 08/22/17  Yes [provider]  polyethylene glycol (MIRALAX / GLYCOLAX) packet Take 17 g by mouth daily. 09/29/16  Yes Allayne Butcher B, PA-C  topiramate (TOPAMAX) 25 MG tablet Take 1 tablet (25 mg total) by mouth at bedtime. 09/29/16  Yes Dixon,  Patriciaann Clan, PA-C  varenicline (CHANTIX PAK) 0.5 MG X 11 & 1 MG X 42 tablet Take one 0.5 mg tablet by mouth once daily for 3 days, then increase to one 0.5 mg tablet twice daily for 4 days, then increase to one 1 mg tablet twice daily. Patient not taking: Reported on 05/17/2018 09/05/17   Allayne Butcher B, PA-C     Allergies  Allergen Reactions  . Demerol [Meperidine] Other (See Comments)    Hallucinations  . Morphine And Related Itching and Rash  . Sulfa Antibiotics Rash     Family History  Problem Relation Age of Onset  . Miscarriages / Stillbirths Maternal Grandmother   . Alzheimer's disease Maternal Grandmother   . Arthritis Maternal  Grandfather   . Hyperlipidemia Maternal Grandfather   . Hypertension Maternal Grandfather   . Stroke Maternal Grandfather   . Arthritis Paternal Grandmother   . Stroke Paternal Grandmother   . Hearing loss Paternal Grandfather   . Hypertension Paternal Grandfather   . Stroke Paternal Grandfather      Social History   Socioeconomic History  . Marital status: Married    Spouse name: Not on file  . Number of children: Not on file  . Years of education: Not on file  . Highest education level: Not on file  Occupational History  . Not on file  Social Needs  . Financial resource strain: Not on file  . Food insecurity:    Worry: Not on file    Inability: Not on file  . Transportation needs:    Medical: Not on file    Non-medical: Not on file  Tobacco Use  . Smoking status: Current Every Day Smoker    Packs/day: 0.50    Years: 35.00    Pack years: 17.50    Types: Cigarettes  . Smokeless tobacco: Never Used  Substance and Sexual Activity  . Alcohol use: No  . Drug use: No  . Sexual activity: Yes    Birth control/protection: Surgical  Lifestyle  . Physical activity:    Days per week: Not on file    Minutes per session: Not on file  . Stress: Not on file  Relationships  . Social connections:    Talks on phone: Not on file    Gets together: Not on file    Attends religious service: Not on file    Active member of club or organization: Not on file    Attends meetings of clubs or organizations: Not on file    Relationship status: Not on file  . Intimate partner violence:    Fear of current or ex partner: Not on file    Emotionally abused: Not on file    Physically abused: Not on file    Forced sexual activity: Not on file  Other Topics Concern  . 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    When she has to leave for appointments etc. she  has to arrange for other family to care for him.     Review of Systems  Constitutional: Negative.  Negative for activity change, appetite change, chills, diaphoresis, fatigue, fever and unexpected weight change.  HENT: Negative.   Eyes: Negative.   Respiratory: Negative.   Cardiovascular: Negative.   Gastrointestinal: Negative.  Negative for abdominal distention, abdominal pain, blood in  stool, diarrhea, nausea and vomiting.  Endocrine: Negative.   Genitourinary: Positive for decreased urine volume, dysuria, flank pain, frequency and urgency. Negative for difficulty urinating, dyspareunia, enuresis, genital sores, hematuria, menstrual problem, pelvic pain, vaginal bleeding, vaginal discharge and vaginal pain.  Musculoskeletal: Positive for back pain. Negative for gait problem.  Skin: Negative.  Negative for color change, pallor and rash.  Allergic/Immunologic: Negative.  Negative for immunocompromised state.  Neurological: Negative.  Negative for dizziness, syncope, weakness and light-headedness.  Hematological: Negative.   Psychiatric/Behavioral: Negative.   All other systems reviewed and are negative.      Objective:    Vitals:   05/17/18 0946  BP: 108/70  Pulse: 78  Resp: 15  Temp: 98.2 F (36.8 C)  TempSrc: Oral  SpO2: 96%  Weight: 129 lb 6 oz (58.7 kg)  Height: 5' 7.5" (1.715 m)      Physical Exam  Constitutional: She appears well-developed and well-nourished. No distress.  HENT:  Head: Normocephalic and atraumatic.  Nose: Nose normal.  Mouth/Throat: Oropharynx is clear and moist.  Eyes: Pupils are equal, round, and reactive to light. Conjunctivae are normal. Right eye exhibits no discharge. Left eye exhibits no discharge.  Neck: No tracheal deviation present.  Cardiovascular: Normal rate, regular rhythm, normal heart sounds and intact distal pulses. Exam reveals no gallop and no friction rub.  No murmur heard. Pulmonary/Chest: Effort normal and breath sounds  normal. No stridor. No respiratory distress. She has no wheezes. She has no rales.  Abdominal: Soft. Bowel sounds are normal. She exhibits no distension and no mass. There is no tenderness. There is no guarding.  No CVA tenderness bilaterally  Musculoskeletal: Normal range of motion.  Neurological: She is alert. She exhibits normal muscle tone. Coordination normal.  Skin: Skin is warm and dry. No rash noted. She is not diaphoretic.  Psychiatric: She has a normal mood and affect. Her behavior is normal.  Nursing note and vitals reviewed.    Results for orders placed or performed in visit on 05/17/18  Urinalysis, Routine w reflex microscopic  Result Value Ref Range   Color, Urine YELLOW YELLOW   APPearance CLOUDY (A) CLEAR   Specific Gravity, Urine 1.010 1.001 - 1.03   pH 6.0 5.0 - 8.0   Glucose, UA NEGATIVE NEGATIVE   Bilirubin Urine NEGATIVE NEGATIVE   Ketones, ur NEGATIVE NEGATIVE   Hgb urine dipstick 2+ (A) NEGATIVE   Protein, ur NEGATIVE NEGATIVE   Nitrite NEGATIVE NEGATIVE   Leukocytes, UA 2+ (A) NEGATIVE   WBC, UA 20-40 (A) 0 - 5 /HPF   RBC / HPF 3-10 (A) 0 - 2 /HPF   Squamous Epithelial / LPF 0-5 < OR = 5 /HPF   Bacteria, UA FEW (A) NONE SEEN /HPF  Microscopic Message  Result Value Ref Range   Note          Assessment & Plan:      ICD-10-CM   1. Urinary tract infection with hematuria, site unspecified N39.0 Urinalysis, Routine w reflex microscopic   R31.9 cephALEXin (KEFLEX) 500 MG capsule    phenazopyridine (PYRIDIUM) 200 MG tablet    Urine Culture    Microscopic Message  2. Smoker F17.200 varenicline (CHANTIX STARTING MONTH PAK) 0.5 MG X 11 & 1 MG X 42 tablet    Urinary symptoms for the past 4 to 5 days acutely worsened this morning, recent decrease of fluid intake because of busy schedule.  Associated back pain although she is unsure of its muscular and related  to chronic back pain that comes and goes.  Afebrile without any other constitutional symptoms.   Urinalysis was pertinent for 2+ blood and 2+ leuks, will treat empirically with Keflex 500 mg 4 times daily for 5 days.  Last UTI was about 1 year ago, added urine culture to be slightly more cautious with her reports of back pain, though exam is not concerning for pyelonephritis.  She also requests a refill of Chantix which was prescribed for her I do not see any contraindications.  Patient to follow-up if not improving with her urinary symptoms in the next 3 to 5 days although we will be in contact with her regarding culture results.  Patient encouraged to follow-up in 1 month for Chantix refill   Danelle Berry, PA-C 05/17/18 10:19 AM

## 2018-05-17 NOTE — Patient Instructions (Signed)
Do antibiotics for 5 days and can use the pyridium for the burning pain.  I will call with urine culture results.    Good luck with Chantix and stopping smoking.  Please let us know if there is anything we can do to help support you in this.  Please call for the next refill (with 72 hours notice).     Steps to Quit Smoking Smoking tobacco can be bad for your health. It can also affect almost every organ in your body. Smoking puts you and people around you at risk for many serious long-lasting (chronic) diseases. Quitting smoking is hard, but it is one of the best things that you can do for your health. It is never too late to quit. What are the benefits of quitting smoking? When you quit smoking, you lower your risk for getting serious diseases and conditions. They can include:  Lung cancer or lung disease.  Heart disease.  Stroke.  Heart attack.  Not being able to have children (infertility).  Weak bones (osteoporosis) and broken bones (fractures).  If you have coughing, wheezing, and shortness of breath, those symptoms may get better when you quit. You may also get sick less often. If you are pregnant, quitting smoking can help to lower your chances of having a baby of low birth weight. What can I do to help me quit smoking? Talk with your doctor about what can help you quit smoking. Some things you can do (strategies) include:  Quitting smoking totally, instead of slowly cutting back how much you smoke over a period of time.  Going to in-person counseling. You are more likely to quit if you go to many counseling sessions.  Using resources and support systems, such as: ? Agricultural engineernline chats with a Veterinary surgeoncounselor. ? Phone quitlines. ? Automotive engineerrinted self-help materials. ? Support groups or group counseling. ? Text messaging programs. ? Mobile phone apps or applications.  Taking medicines. Some of these medicines may have nicotine in them. If you are pregnant or breastfeeding, do not take any  medicines to quit smoking unless your doctor says it is okay. Talk with your doctor about counseling or other things that can help you.  Talk with your doctor about using more than one strategy at the same time, such as taking medicines while you are also going to in-person counseling. This can help make quitting easier. What things can I do to make it easier to quit? Quitting smoking might feel very hard at first, but there is a lot that you can do to make it easier. Take these steps:  Talk to your family and friends. Ask them to support and encourage you.  Call phone quitlines, reach out to support groups, or work with a Veterinary surgeoncounselor.  Ask people who smoke to not smoke around you.  Avoid places that make you want (trigger) to smoke, such as: ? Bars. ? Parties. ? Smoke-break areas at work.  Spend time with people who do not smoke.  Lower the stress in your life. Stress can make you want to smoke. Try these things to help your stress: ? Getting regular exercise. ? Deep-breathing exercises. ? Yoga. ? Meditating. ? Doing a body scan. To do this, close your eyes, focus on one area of your body at a time from head to toe, and notice which parts of your body are tense. Try to relax the muscles in those areas.  Download or buy apps on your mobile phone or tablet that can help you stick  to your quit plan. There are many free apps, such as QuitGuide from the [employer] Office manager for Disease Control and Prevention). You can find more support from smokefree.gov and other websites.  This information is not intended to replace advice given to you by your health care provider. Make sure you discuss any questions you have with your health care provider. Document Released: 03/25/2009 Document Revised: 01/25/2016 Document Reviewed: 10/13/2014 Elsevier Interactive Patient Education  2018 [employer].

## 2018-05-19 LAB — URINE CULTURE
MICRO NUMBER:: 91463720
SPECIMEN QUALITY:: ADEQUATE

## 2018-05-20 ENCOUNTER — Encounter: Payer: Self-pay | Admitting: Family Medicine

## 2018-05-20 NOTE — Telephone Encounter (Signed)
Call placed to patient and patient made aware.   States that she will try to see if she can get [facility]. Will call back to keep us informed.

## 2018-09-10 ENCOUNTER — Other Ambulatory Visit: Payer: Self-pay

## 2018-12-11 ENCOUNTER — Other Ambulatory Visit: Payer: Self-pay | Admitting: Family Medicine

## 2018-12-11 MED ORDER — EEMT 1.25-2.5 MG PO TABS: 1.0000 | ORAL_TABLET | Freq: Every day | ORAL | 0 refills | Status: DC

## 2018-12-11 NOTE — Telephone Encounter (Signed)
Ok thanks 

## 2018-12-11 NOTE — Telephone Encounter (Signed)
Refill on eemt to [facility]. Pt has scheduled app for next week

## 2018-12-12 MED ORDER — EEMT 1.25-2.5 MG PO TABS: 1.0000 | ORAL_TABLET | Freq: Every day | ORAL | 5 refills | Status: DC

## 2018-12-12 NOTE — Telephone Encounter (Signed)
Apparently this is a controlled medication - can you please send to pharm - it will not let me do it. Thanks

## 2018-12-18 ENCOUNTER — Ambulatory Visit: Admitting: Family Medicine

## 2019-01-09 ENCOUNTER — Encounter: Payer: Self-pay | Admitting: Family Medicine

## 2019-01-09 ENCOUNTER — Other Ambulatory Visit: Payer: Self-pay

## 2019-01-09 ENCOUNTER — Ambulatory Visit (INDEPENDENT_AMBULATORY_CARE_PROVIDER_SITE_OTHER): Admitting: Family Medicine

## 2019-01-09 VITALS — BP 112/64 | HR 96 | Temp 98.2°F | Resp 14 | Ht 67.5 in | Wt 134.0 lb

## 2019-01-09 DIAGNOSIS — R319 Hematuria, unspecified: Secondary | ICD-10-CM

## 2019-01-09 DIAGNOSIS — N760 Acute vaginitis: Secondary | ICD-10-CM | POA: Diagnosis not present

## 2019-01-09 DIAGNOSIS — N390 Urinary tract infection, site not specified: Secondary | ICD-10-CM | POA: Diagnosis not present

## 2019-01-09 LAB — URINALYSIS, ROUTINE W REFLEX MICROSCOPIC
Bilirubin Urine: NEGATIVE
Glucose, UA: NEGATIVE
Hyaline Cast: NONE SEEN /LPF
Nitrite: NEGATIVE
Specific Gravity, Urine: 1.02 (ref 1.001–1.03)
pH: 6 (ref 5.0–8.0)

## 2019-01-09 LAB — MICROSCOPIC MESSAGE

## 2019-01-09 MED ORDER — FLUCONAZOLE 150 MG PO TABS: 150.0000 mg | ORAL_TABLET | ORAL | 0 refills | Status: DC | PRN

## 2019-01-09 MED ORDER — PHENAZOPYRIDINE HCL 200 MG PO TABS: 200.0000 mg | ORAL_TABLET | Freq: Three times a day (TID) | ORAL | 0 refills | Status: DC | PRN

## 2019-01-09 MED ORDER — METRONIDAZOLE 500 MG PO TABS: 500.0000 mg | ORAL_TABLET | Freq: Two times a day (BID) | ORAL | 0 refills | Status: AC

## 2019-01-09 MED ORDER — CEPHALEXIN 500 MG PO CAPS: 500.0000 mg | ORAL_CAPSULE | Freq: Three times a day (TID) | ORAL | 0 refills | Status: AC

## 2019-01-09 NOTE — Progress Notes (Signed)
Patient ID: Latasha Martinez, female    DOB: 1960-09-04, [age] y.o.   MRN: [id number]  PCP: Danelle Berryapia, Aaronjames Kelsay, PA-C  Chief Complaint  Patient presents with  . Dysuria    burning with urination, meatal smell to urine    Subjective:   Latasha AoDonna Sellen is a 58 y.o. female, presents to clinic with CC of dysuria that occurred for one day and with pushing fluids it felt a little better 2 days ago, but yesterday it was severe pain with "fire" and back pain.  No fever chills, sweats, nausea, vomiting, abdominal pain.  No genital lesions/rash, swelling or itching.  She reports that with sweating or bathing she will frequently and easily get vaginal sx with irritation, profuse discharge.  She has treated in the past with yeast vaginitis meds, but mostly she is careful to avoid activity or bathing so that she will not have vaginitis.  With last antibiotics she had secondary yeast infection, had an old diflucan that she took which took care of it, but she requests diflucan with abx.  She also went to her pain management MD shortly after last visit in Dec 2019 for UTI.  She had resolved sx after abx and she did complete them, but 3 days later she did have urine odor again, a nurse and the pain clinic dipped it, and she asked the MD to give abx again, which he did.  Her urine sx and smell then resolved.   With current sx the "fire" and "burning": and dysuria are located around urethra, hurts after urination mostly, and no lesions, swelling or burning of vulvovaginal area.   She is s/p hysterectomy.    She had to cancel routine visits over the past couple months due to family issues preventing her from coming and also COVID pushed some appts back, she is going to decided wether to reestablish here with other PCP or [facility] - she has letter at home, and will decide and call either office to arrange her needed f/up appts/CPE etc.   Patient Active Problem List    Diagnosis Date Noted  . Osteopenia 04/13/2016  . 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 Vitamin D deficiency 08/06/2014  . Smoker 07/22/2014  . GERD (gastroesophageal reflux disease)   . Chronic pain      Prior to Admission medications   Medication Sig Start Date End Date Taking? Authorizing Provider  acyclovir (ZOVIRAX) 400 MG tablet Take 1 tablet (400 mg total) by mouth 3 (three) times daily as needed. 10/15/17  Yes Dorena Bodoixon, Mary B, PA-C  Calcium Carbonate-Vit D-Min (CALCIUM 1200) 1200-1000 MG-UNIT CHEW Chew 1,000 mg by mouth daily. 09/29/16  Yes Dorena Bodoixon, Mary B, PA-C  Cholecalciferol 4000 units CAPS Take 1 capsule (4,000 Units total) by mouth daily. 09/29/16  Yes Dixon, Mary B, PA-C  Est Estrogens-Methyltest (EEMT) 1.25-2.5 MG TABS Take 1 tablet by mouth daily. 12/12/18  Yes [place], Velna HatchetKawanta F, MD  methocarbamol (ROBAXIN) 750 MG tablet Take 1,500 mg by mouth 3 (three) times daily. 04/15/18  Yes [provider]  omeprazole (PRILOSEC) 20 MG capsule TAKE 1 CAPSULE BY MOUTH EVERY DAY 09/29/16  Yes Dorena Bodoixon, Mary B, PA-C  oxyCODONE-acetaminophen (PERCOCET) 10-325 MG tablet Take 1 tablet by mouth every 6 (six) hours as needed for pain. 02/17/16  Yes Dixon, Mary B, PA-C  OXYCONTIN 15 MG 12 hr tablet Take 15 mg by mouth 2 (two) times daily. 08/22/17  Yes [provider]  phenazopyridine (PYRIDIUM) 200 MG tablet Take 1 tablet (200 mg total) by mouth 3 (three) times daily as needed for pain. 05/17/18  Yes Danelle Berryapia, Jamirah Zelaya, PA-C  polyethylene glycol (MIRALAX / GLYCOLAX) packet Take 17 g by mouth daily. 09/29/16  Yes Allayne Butcherixon, Mary B, PA-C  topiramate (TOPAMAX) 25 MG tablet Take 1 tablet (25 mg total) by mouth at bedtime. 09/29/16  Yes Dorena Bodoixon, Mary B, PA-C     Allergies  Allergen Reactions  . Demerol [Meperidine] Other (See Comments)    Hallucinations  . Morphine And Related Itching and Rash  . Sulfa Antibiotics Rash     Family History  Problem Relation Age of Onset  . Miscarriages / Stillbirths Maternal  Grandmother   . Alzheimer's disease Maternal Grandmother   . Arthritis Maternal Grandfather   . Hyperlipidemia Maternal Grandfather   . Hypertension Maternal Grandfather   . Stroke Maternal Grandfather   . Arthritis Paternal Grandmother   . Stroke Paternal Grandmother   . Hearing loss Paternal Grandfather   . Hypertension Paternal Grandfather   . Stroke Paternal Grandfather      Social History   Socioeconomic History  . Marital status: Married    Spouse name: Not on file  . Number of children: Not on file  . Years of education: Not on file  . Highest education level: Not on file  Occupational History  . Not on file  Social Needs  . Financial resource strain: Not on file  . Food insecurity    Worry: Not on file    Inability: Not on file  . Transportation needs    Medical: Not on file    Non-medical: Not on file  Tobacco Use  . Smoking status: Current Every Day Smoker    Packs/day: 0.50    Years: 35.00    Pack years: 17.50    Types: Cigarettes  . Smokeless tobacco: Never Used  Substance and Sexual Activity  . Alcohol use: No  . Drug use: No  . Sexual activity: Yes    Birth control/protection: Surgical  Lifestyle  . Physical activity    Days per week: Not on file    Minutes per session: Not on file  . Stress: Not on file  Relationships  . Social Musicianconnections    Talks on phone: Not on file    Gets together: Not on file    Attends religious service: Not on file    Active member of club or organization: Not on file    Attends meetings of clubs or organizations: Not on file    Relationship status: Not on file  . Intimate partner violence    Fear of current or ex partner: Not on file    Emotionally abused: Not on file    Physically abused: Not on file    Forced sexual activity: Not on file  Other Topics Concern  . Not on file  Social History Narrative   Entered 07/2014:    Says her husband is "a disabled Vet"   Says he has Traumatic Brain Injury   She says  that she is his caretaker.    When she has to leave for appointments etc. she has to arrange for other family to care for him.     Review of Systems  Constitutional: Negative.  Negative for activity change, appetite change, chills, diaphoresis, fatigue and fever.  HENT: Negative.   Eyes: Negative.   Respiratory: Negative.   Cardiovascular: Negative.   Gastrointestinal: Negative.  Negative for abdominal pain,  constipation, nausea and vomiting.  Endocrine: Negative.   Genitourinary: Positive for dysuria, flank pain, frequency and urgency. Negative for difficulty urinating, genital sores, hematuria, pelvic pain, vaginal bleeding, vaginal discharge and vaginal pain.  Musculoskeletal: Positive for back pain (unsure if chronic or her "kidneys").  Skin: Negative.  Negative for color change and pallor.  Allergic/Immunologic: Negative.   Neurological: Negative.  Negative for dizziness, weakness and headaches.  Hematological: Negative.   Psychiatric/Behavioral: Negative.   All other systems reviewed and are negative.      Objective:    Vitals:   01/09/19 1231  BP: 112/64  Pulse: 96  Resp: 14  Temp: 98.2 F (36.8 C)  TempSrc: Oral  SpO2: 97%  Weight: 134 lb (60.8 kg)  Height: 5' 7.5" (1.715 m)      Physical Exam Vitals signs and nursing note reviewed.  Constitutional:      General: She is not in acute distress.    Appearance: Normal appearance. She is well-developed. She is not ill-appearing, toxic-appearing or diaphoretic.  HENT:     Head: Normocephalic and atraumatic.     Right Ear: External ear normal.     Left Ear: External ear normal.  Eyes:     General: No scleral icterus.       Right eye: No discharge.        Left eye: No discharge.     Conjunctiva/sclera: Conjunctivae normal.  Neck:     Trachea: No tracheal deviation.  Cardiovascular:     Rate and Rhythm: Normal rate and regular rhythm.     Pulses: Normal pulses.     Heart sounds: Normal heart sounds. No  murmur. No friction rub. No gallop.   Pulmonary:     Effort: Pulmonary effort is normal. No respiratory distress.     Breath sounds: Normal breath sounds. No stridor. No wheezing, rhonchi or rales.  Abdominal:     General: Bowel sounds are normal. There is no distension.     Palpations: Abdomen is soft.     Tenderness: There is no abdominal tenderness. There is no right CVA tenderness, left CVA tenderness, guarding or rebound. Negative signs include Murphy's sign and McBurney's sign.  Musculoskeletal: Normal range of motion.        General: No swelling, tenderness or deformity.     Right lower leg: No edema.     Left lower leg: No edema.  Skin:    General: Skin is warm and dry.     Findings: No rash.  Neurological:     Mental Status: She is alert.     Motor: No weakness or abnormal muscle tone.     Coordination: Coordination normal.     Gait: Gait normal.  Psychiatric:        Mood and Affect: Mood normal.        Behavior: Behavior normal.      Results for orders placed or performed in visit on 01/09/19  Urinalysis, Routine w reflex microscopic  Result Value Ref Range   Color, Urine YELLOW YELLOW   APPearance CLOUDY (A) CLEAR   Specific Gravity, Urine 1.020 1.001 - 1.03   pH 6.0 5.0 - 8.0   Glucose, UA NEGATIVE NEGATIVE   Bilirubin Urine NEGATIVE NEGATIVE   Ketones, ur TRACE (A) NEGATIVE   Hgb urine dipstick 1+ (A) NEGATIVE   Protein, ur 1+ (A) NEGATIVE   Nitrite NEGATIVE NEGATIVE   Leukocytes,Ua 3+ (A) NEGATIVE   WBC, UA 40-60 (A) 0 - 5 /HPF  RBC / HPF 3-10 (A) 0 - 2 /HPF   Squamous Epithelial / LPF 0-5 < OR = 5 /HPF   Bacteria, UA FEW (A) NONE SEEN /HPF   Hyaline Cast NONE SEEN NONE SEEN /LPF  Microscopic Message  Result Value Ref Range   Note          Assessment & Plan:      ICD-10-CM   1. Urinary tract infection with hematuria, site unspecified  N39.0 Urine Culture   R31.9 Urinalysis, Routine w reflex microscopic    cephALEXin (KEFLEX) 500 MG capsule     Microscopic Message    fluconazole (DIFLUCAN) 150 MG tablet    DISCONTINUED: phenazopyridine (PYRIDIUM) 200 MG tablet   abd exam benign, UA consistent with recurrent UTI, culture added.  last Rx for keflex x 5 d she had sx return, will do 7 d this time and follow culture  2. Acute vaginitis  N76.0 metroNIDAZOLE (FLAGYL) 500 MG tablet   easily gets vaginal discharge and irritation - sounds like BV, no current sx, flagyl rx printed for next time she has with bathing/sweating outside    Pt gets secondary yeast vaginitis sx when on abx, diflucan sent to pharmacy with culture.  Pt will decide where to continue care and will arrange her needed routine follow up  Danelle BerryLeisa Machi Whittaker, PA-C 01/09/19 12:43 PM

## 2019-01-09 NOTE — Patient Instructions (Signed)
Return in urinary symptoms do not improve or if they come back.  See info below about yeast infections or BV, since they are different    Vaginal Yeast infection, Adult  Vaginal yeast infection is a condition that causes vaginal discharge as well as soreness, swelling, and redness (inflammation) of the vagina. This is a common condition. Some women get this infection frequently. What are the causes? This condition is caused by a change in the normal balance of the yeast (candida) and bacteria that live in the vagina. This change causes an overgrowth of yeast, which causes the inflammation. What increases the risk? The condition is more likely to develop in women who:  Take antibiotic medicines.  Have diabetes.  Take birth control pills.  Are pregnant.  Douche often.  Have a weak body defense system (immune system).  Have been taking steroid medicines for a long time.  Frequently wear tight clothing. What are the signs or symptoms? Symptoms of this condition include:  White, thick, creamy vaginal discharge.  Swelling, itching, redness, and irritation of the vagina. The lips of the vagina (vulva) may be affected as well.  Pain or a burning feeling while urinating.  Pain during sex. How is this diagnosed? This condition is diagnosed based on:  Your medical history.  A physical exam.  A pelvic exam. Your health care provider will examine a sample of your vaginal discharge under a microscope. Your health care provider may send this sample for testing to confirm the diagnosis. How is this treated? This condition is treated with medicine. Medicines may be over-the-counter or prescription. You may be told to use one or more of the following:  Medicine that is taken by mouth (orally).  Medicine that is applied as a cream (topically).  Medicine that is inserted directly into the vagina (suppository). Follow these instructions at home:  Lifestyle  Do not have sex until  your health care provider approves. Tell your sex partner that you have a yeast infection. That person should go to his or her health care provider and ask if they should also be treated.  Do not wear tight clothes, such as pantyhose or tight pants.  Wear breathable cotton underwear. General instructions  Take or apply over-the-counter and prescription medicines only as told by your health care provider.  Eat more yogurt. This may help to keep your yeast infection from returning.  Do not use tampons until your health care provider approves.  Try taking a sitz bath to help with discomfort. This is a warm water bath that is taken while you are sitting down. The water should only come up to your hips and should cover your buttocks. Do this 3-4 times per day or as told by your health care provider.  Do not douche.  If you have diabetes, keep your blood sugar levels under control.  Keep all follow-up visits as told by your health care provider. This is important. Contact a health care provider if:  You have a fever.  Your symptoms go away and then return.  Your symptoms do not get better with treatment.  Your symptoms get worse.  You have new symptoms.  You develop blisters in or around your vagina.  You have blood coming from your vagina and it is not your menstrual period.  You develop pain in your abdomen. Summary  Vaginal yeast infection is a condition that causes discharge as well as soreness, swelling, and redness (inflammation) of the vagina.  This condition is treated  with medicine. Medicines may be over-the-counter or prescription.  Take or apply over-the-counter and prescription medicines only as told by your health care provider.  Do not douche. Do not have sex or use tampons until your health care provider approves.  Contact a health care provider if your symptoms do not get better with treatment or your symptoms go away and then return. This information is not  intended to replace advice given to you by your health care provider. Make sure you discuss any questions you have with your health care provider. Document Released: 03/08/2005 Document Revised: 10/15/2017 Document Reviewed: 10/15/2017 Elsevier Patient Education  2020 [employer]   Bacterial Vaginosis  Bacterial vaginosis is an infection of the vagina. It happens when too many normal germs (healthy bacteria) grow in the vagina. This infection puts you at risk for infections from sex (STIs). Treating this infection can lower your risk for some STIs. You should also treat this if you are pregnant. It can cause your baby to be born early. Follow these instructions at home: Medicines  Take over-the-counter and prescription medicines only as told by your doctor.  Take or use your antibiotic medicine as told by your doctor. Do not stop taking or using it even if you start to feel better. General instructions  If you your sexual partner is a woman, tell her that you have this infection. She needs to get treatment if she has symptoms. If you have a female partner, he does not need to be treated.  During treatment: ? Avoid sex. ? Do not douche. ? Avoid alcohol as told. ? Avoid breastfeeding as told.  Drink enough fluid to keep your pee (urine) clear or pale yellow.  Keep your vagina and butt (rectum) clean. ? Wash the area with warm water every day. ? Wipe from front to back after you use the toilet.  Keep all follow-up visits as told by your doctor. This is important. Preventing this condition  Do not douche.  Use only warm water to wash around your vagina.  Use protection when you have sex. This includes: ? Latex condoms. ? Dental dams.  Limit how many people you have sex with. It is best to only have sex with the same person (be monogamous).  Get tested for STIs. Have your partner get tested.  Wear underwear that is cotton or lined with cotton.  Avoid tight pants and  pantyhose. This is most important in summer.  Do not use any products that have nicotine or tobacco in them. These include cigarettes and e-cigarettes. If you need help quitting, ask your doctor.  Do not use illegal drugs.  Limit how much alcohol you drink. Contact a doctor if:  Your symptoms do not get better, even after you are treated.  You have more discharge or pain when you pee (urinate).  You have a fever.  You have pain in your belly (abdomen).  You have pain with sex.  Your bleed from your vagina between periods. Summary  This infection happens when too many germs (bacteria) grow in the vagina.  Treating this condition can lower your risk for some infections from sex (STIs).  You should also treat this if you are pregnant. It can cause early (premature) birth.  Do not stop taking or using your antibiotic medicine even if you start to feel better. This information is not intended to replace advice given to you by your health care provider. Make sure you discuss any questions you have with your  health care provider. Document Released: 03/07/2008 Document Revised: 05/11/2017 Document Reviewed: 02/12/2016 Elsevier Patient Education  2020 [employer].

## 2019-01-11 LAB — URINE CULTURE
MICRO NUMBER:: 720515
SPECIMEN QUALITY:: ADEQUATE

## 2019-02-03 ENCOUNTER — Other Ambulatory Visit: Payer: Self-pay

## 2019-02-03 ENCOUNTER — Encounter: Payer: Self-pay | Admitting: Family Medicine

## 2019-02-03 ENCOUNTER — Ambulatory Visit (INDEPENDENT_AMBULATORY_CARE_PROVIDER_SITE_OTHER): Admitting: Family Medicine

## 2019-02-03 VITALS — BP 124/72 | HR 70 | Temp 98.4°F | Resp 14 | Ht 67.5 in | Wt 139.0 lb

## 2019-02-03 DIAGNOSIS — R319 Hematuria, unspecified: Secondary | ICD-10-CM

## 2019-02-03 DIAGNOSIS — R300 Dysuria: Secondary | ICD-10-CM | POA: Diagnosis not present

## 2019-02-03 DIAGNOSIS — N390 Urinary tract infection, site not specified: Secondary | ICD-10-CM

## 2019-02-03 DIAGNOSIS — R350 Frequency of micturition: Secondary | ICD-10-CM

## 2019-02-03 LAB — URINALYSIS, ROUTINE W REFLEX MICROSCOPIC
Bilirubin Urine: NEGATIVE
Hyaline Cast: NONE SEEN /LPF
Ketones, ur: NEGATIVE
Nitrite: POSITIVE — AB
Protein, ur: NEGATIVE
Specific Gravity, Urine: 1.005 (ref 1.001–1.03)
pH: 5.5 (ref 5.0–8.0)

## 2019-02-03 LAB — MICROSCOPIC MESSAGE

## 2019-02-03 MED ORDER — CEPHALEXIN 500 MG PO CAPS: 500.0000 mg | ORAL_CAPSULE | Freq: Three times a day (TID) | ORAL | 0 refills | Status: DC

## 2019-02-03 MED ORDER — NITROFURANTOIN MONOHYD MACRO 100 MG PO CAPS: 100.0000 mg | ORAL_CAPSULE | Freq: Every day | ORAL | 0 refills | Status: DC

## 2019-02-03 NOTE — Progress Notes (Signed)
Subjective:    Patient ID: Latasha Martinez Martinez, female    DOB: 16-Dec-1960, 58 y.o.   MRN: [id number]  HPI Patient is a very pleasant 58 year old Caucasian female who presents today complaining of symptoms of a bladder infection.  She states that her symptoms began Saturday.  Symptoms include urgency, frequency, and dysuria.  Urinalysis is consistent with a urinary tract infection with leukocyte esterase, nitrites, and blood.  Patient states that she gets this frequently.  It usually occurs after sexual intercourse.  She is emptying her bladder after sex every time however this has not stop this from happening.  She also does not complain of any vaginal dryness.  We had a discussion today about preventative strategies.  She does not believe the vaginal estrogen would help because she does not experience vaginal dryness.  She is interested in potentially having an antibiotic that she uses postcoitally in an effort to try to prevent these from reoccurring. Past Medical History:  Diagnosis Date  . Chronic pain   . GERD (gastroesophageal reflux disease)   . Ovarian remnant syndrome 2004   Past Surgical History:  Procedure Laterality Date  . ABDOMINAL HYSTERECTOMY  2000  . FRACTURE SURGERY  2008   left leg kicked by horse  . TUMOR REMOVAL  2004   ovarian remnant syndrome   Current Outpatient Medications on File Prior to Visit  Medication Sig Dispense Refill  . acyclovir (ZOVIRAX) 400 MG tablet Take 1 tablet (400 mg total) by mouth 3 (three) times daily as needed. 90 tablet 1  . Calcium Carbonate-Vit D-Min (CALCIUM 1200) 1200-1000 MG-UNIT CHEW Chew 1,000 mg by mouth daily. 90 each 1  . Cholecalciferol 4000 units CAPS Take 1 capsule (4,000 Units total) by mouth daily. 90 capsule 1  . Est Estrogens-Methyltest (EEMT) 1.25-2.5 MG TABS Take 1 tablet by mouth daily. 30 tablet 5  . fluconazole (DIFLUCAN) 150 MG tablet Take 1 tablet (150 mg total) by mouth every 3 (three) days as needed (for vaginal  itching/yeast infection sx). 2 tablet 0  . methocarbamol (ROBAXIN) 750 MG tablet Take 1,500 mg by mouth 3 (three) times daily.  2  . omeprazole (PRILOSEC) 20 MG capsule TAKE 1 CAPSULE BY MOUTH EVERY DAY 90 capsule 1  . oxyCODONE-acetaminophen (PERCOCET) 10-325 MG tablet Take 1 tablet by mouth every 6 (six) hours as needed for pain. 48 tablet 0  . OXYCONTIN 15 MG 12 hr tablet Take 15 mg by mouth 2 (two) times daily.  0  . polyethylene glycol (MIRALAX / GLYCOLAX) packet Take 17 g by mouth daily. 14 each 3  . topiramate (TOPAMAX) 25 MG tablet Take 1 tablet (25 mg total) by mouth at bedtime. 90 tablet 1   No current facility-administered medications on file prior to visit.    Allergies  Allergen Reactions  . Demerol [Meperidine] Other (See Comments)    Hallucinations  . Morphine And Related Itching and Rash  . Sulfa Antibiotics Rash   Social History   Socioeconomic History  . Marital status: Married    Spouse name: Not on file  . Number of children: Not on file  . Years of education: Not on file  . Highest education level: Not on file  Occupational History  . Not on file  Social Needs  . Financial resource strain: Not on file  . Food insecurity    Worry: Not on file    Inability: Not on file  . Transportation needs    Medical: Not on file  Non-medical: Not on file  Tobacco Use  . Smoking status: Current Every Day Smoker    Packs/day: 0.50    Years: 35.00    Pack years: 17.50    Types: Cigarettes  . Smokeless tobacco: Never Used  Substance and Sexual Activity  . Alcohol use: No  . Drug use: No  . Sexual activity: Yes    Birth control/protection: Surgical  Lifestyle  . Physical activity    Days per week: Not on file    Minutes per session: Not on file  . Stress: Not on file  Relationships  . Social Herbalist on phone: Not on file    Gets together: Not on file    Attends religious service: Not on file    Active member of club or organization: Not on file     Attends meetings of clubs or organizations: Not on file    Relationship status: Not on file  . Intimate partner violence    Fear of current or ex partner: Not on file    Emotionally abused: Not on file    Physically abused: Not on file    Forced sexual activity: Not on file  Other Topics Concern  . 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    When she has to leave for appointments etc. she has to arrange for other family to care for him.      Review of Systems  All other systems reviewed and are negative.      Objective:   Physical Exam Vitals signs reviewed.  Constitutional:      General: She is not in acute distress.    Appearance: Normal appearance. She is normal weight. She is not ill-appearing or toxic-appearing.  Cardiovascular:     Rate and Rhythm: Normal rate and regular rhythm.     Heart sounds: Normal heart sounds.  Pulmonary:     Effort: Pulmonary effort is normal.     Breath sounds: Normal breath sounds.  Abdominal:     Tenderness: There is no abdominal tenderness. There is no right CVA tenderness, left CVA tenderness or guarding.  Neurological:     Mental Status: She is alert.           Assessment & Plan:  Frequent urination - Plan: Urinalysis, Routine w reflex microscopic  Burning with urination - Plan: Urinalysis, Routine w reflex microscopic  Urinary tract infection with hematuria, site unspecified  Patient appears to have a urinary tract infection.  I will give her Keflex 500 mg p.o. 3 times daily for 7 days.  We also spent the remainder of the time discussing preventative strategies.  After long discussion, the patient would like to use nitrofurantoin one 1 tablet after sexual intercourse.  She will use this sparingly to try to limit potential bacterial resistance.  Gave her 30 tablets.  Hopefully this would last a  year or more.

## 2019-02-11 ENCOUNTER — Encounter: Payer: Self-pay | Admitting: Family Medicine

## 2019-02-19 ENCOUNTER — Telehealth: Payer: Self-pay | Admitting: Family Medicine

## 2019-02-19 NOTE — Telephone Encounter (Signed)
Patient called in requesting a referral to see a nephrologist. Patient states that at her appointment with her pain clinic they ran labs and her kidney functions are off. I informed patient that we will need lab results she states she will have them faxed over. Can I place referral or would you like to see her since we have not seen her for this issue and do not have any supporting documentation. Please advise?

## 2019-02-20 NOTE — Telephone Encounter (Signed)
Spoke with patient and informed her that she will need an office visit first. Patient verbalized understanding and appointment was scheduled.

## 2019-02-20 NOTE — Telephone Encounter (Signed)
I need to see the patient first to begin work up.

## 2019-02-24 ENCOUNTER — Other Ambulatory Visit: Payer: Self-pay

## 2019-02-25 ENCOUNTER — Ambulatory Visit (INDEPENDENT_AMBULATORY_CARE_PROVIDER_SITE_OTHER): Admitting: Family Medicine

## 2019-02-25 ENCOUNTER — Encounter: Payer: Self-pay | Admitting: Family Medicine

## 2019-02-25 VITALS — BP 116/80 | HR 62 | Temp 98.4°F | Resp 14 | Ht 67.5 in | Wt 136.0 lb

## 2019-02-25 DIAGNOSIS — N390 Urinary tract infection, site not specified: Secondary | ICD-10-CM | POA: Diagnosis not present

## 2019-02-25 DIAGNOSIS — R7989 Other specified abnormal findings of blood chemistry: Secondary | ICD-10-CM | POA: Diagnosis not present

## 2019-02-25 DIAGNOSIS — R319 Hematuria, unspecified: Secondary | ICD-10-CM | POA: Diagnosis not present

## 2019-02-25 LAB — URINALYSIS, ROUTINE W REFLEX MICROSCOPIC
Bilirubin Urine: NEGATIVE
Glucose, UA: NEGATIVE
Hgb urine dipstick: NEGATIVE
Ketones, ur: NEGATIVE
Leukocytes,Ua: NEGATIVE
Nitrite: NEGATIVE
Protein, ur: NEGATIVE
Specific Gravity, Urine: 1.01 (ref 1.001–1.03)
pH: 5.5 (ref 5.0–8.0)

## 2019-02-25 MED ORDER — OMEPRAZOLE 20 MG PO CPDR: DELAYED_RELEASE_CAPSULE | ORAL | 1 refills | Status: DC

## 2019-02-25 NOTE — Progress Notes (Signed)
Subjective:    Patient ID: Latasha Martinez, female    DOB: Aug 30, 1960, 58 y.o.   MRN: [id number]  HPI  02/03/19 Patient is a very pleasant 58 year old Caucasian female who presents today complaining of symptoms of a bladder infection.  She states that her symptoms began Saturday.  Symptoms include urgency, frequency, and dysuria.  Urinalysis is consistent with a urinary tract infection with leukocyte esterase, nitrites, and blood.  Patient states that she gets this frequently.  It usually occurs after sexual intercourse.  She is emptying her bladder after sex every time however this has not stop this from happening.  She also does not complain of any vaginal dryness.  We had a discussion today about preventative strategies.  She does not believe the vaginal estrogen would help because she does not experience vaginal dryness.  She is interested in potentially having an antibiotic that she uses postcoitally in an effort to try to prevent these from reoccurring.  At that time, my plan was: Patient appears to have a urinary tract infection.  I will give her Keflex 500 mg p.o. 3 times daily for 7 days.  We also spent the remainder of the time discussing preventative strategies.  After long discussion, the patient would like to use nitrofurantoin one 1 tablet after sexual intercourse.  She will use this sparingly to try to limit potential bacterial resistance.  Gave her 30 tablets.  Hopefully this would last a year or more.  02/25/19 Patient was recently seen at her pain clinic where they obtain regular lab work.  Her BUN was 6.  Her creatinine was 1.2.  Her GFR was calculated to be 49 mL/min.  Patient was informed that she had kidney damage and was instructed to see a nephrologist.  She is coming today for further evaluation.  Other than frequent urinary tract infections, the patient denies any history of kidney damage.  Urinalysis is obtained today with the patient completely asymptomatic and denying any  symptoms of urinary tract infection.  There is no proteinuria.  There is no hematuria.  There is no evidence of any cellular casts to suggest glomerulonephritis.  Patient has no edema to suggest nephrotic syndrome.  I obtained a history and the patient states that prior to the lab work she been taking 800 mg of ibuprofen 3-4 times a day for 2 months for back pain and headaches.  She has subsequently stopped ibuprofen over the last week and is try to drink more water  Past Medical History:  Diagnosis Date   Chronic pain    GERD (gastroesophageal reflux disease)    Ovarian remnant syndrome 2004   Past Surgical History:  Procedure Laterality Date   ABDOMINAL HYSTERECTOMY  2000   FRACTURE SURGERY  2008   left leg kicked by horse   TUMOR REMOVAL  2004   ovarian remnant syndrome   Current Outpatient Medications on File Prior to Visit  Medication Sig Dispense Refill   acyclovir (ZOVIRAX) 400 MG tablet Take 1 tablet (400 mg total) by mouth 3 (three) times daily as needed. 90 tablet 1   Calcium Carbonate-Vit D-Min (CALCIUM 1200) 1200-1000 MG-UNIT CHEW Chew 1,000 mg by mouth daily. 90 each 1   cephALEXin (KEFLEX) 500 MG capsule Take 1 capsule (500 mg total) by mouth 3 (three) times daily. 21 capsule 0   Cholecalciferol 4000 units CAPS Take 1 capsule (4,000 Units total) by mouth daily. 90 capsule 1   Est Estrogens-Methyltest (EEMT) 1.25-2.5 MG TABS Take 1 tablet by  mouth daily. 30 tablet 5   fluconazole (DIFLUCAN) 150 MG tablet Take 1 tablet (150 mg total) by mouth every 3 (three) days as needed (for vaginal itching/yeast infection sx). 2 tablet 0   methocarbamol (ROBAXIN) 750 MG tablet Take 1,500 mg by mouth 3 (three) times daily.  2   nitrofurantoin, macrocrystal-monohydrate, (MACROBID) 100 MG capsule Take 1 capsule (100 mg total) by mouth at bedtime. 30 capsule 0   omeprazole (PRILOSEC) 20 MG capsule TAKE 1 CAPSULE BY MOUTH EVERY DAY 90 capsule 1   oxyCODONE-acetaminophen  (PERCOCET) 10-325 MG tablet Take 1 tablet by mouth every 6 (six) hours as needed for pain. 48 tablet 0   OXYCONTIN 15 MG 12 hr tablet Take 15 mg by mouth 2 (two) times daily.  0   polyethylene glycol (MIRALAX / GLYCOLAX) packet Take 17 g by mouth daily. 14 each 3   topiramate (TOPAMAX) 25 MG tablet Take 1 tablet (25 mg total) by mouth at bedtime. 90 tablet 1   No current facility-administered medications on file prior to visit.    Allergies  Allergen Reactions   Demerol [Meperidine] Other (See Comments)    Hallucinations   Morphine And Related Itching and Rash   Sulfa Antibiotics Rash   Social History   Socioeconomic History   Marital status: Married    Spouse name: Not on file   Number of children: Not on file   Years of education: Not on file   Highest education level: Not on file  Occupational History   Not on file  Social Needs   Financial resource strain: Not on file   Food insecurity    Worry: Not on file    Inability: Not on file   Transportation needs    Medical: Not on file    Non-medical: Not on file  Tobacco Use   Smoking status: Current Every Day Smoker    Packs/day: 0.50    Years: 35.00    Pack years: 17.50    Types: Cigarettes   Smokeless tobacco: Never Used  Substance and Sexual Activity   Alcohol use: No   Drug use: No   Sexual activity: Yes    Birth control/protection: Surgical  Lifestyle   Physical activity    Days per week: Not on file    Minutes per session: Not on file   Stress: Not on file  Relationships   Social connections    Talks on phone: Not on file    Gets together: Not on file    Attends religious service: Not on file    Active member of club or organization: Not on file    Attends meetings of clubs or organizations: Not on file    Relationship status: Not on file   Intimate partner violence    Fear of current or ex partner: Not on file    Emotionally abused: Not on file    Physically abused: Not on file      Forced sexual activity: Not on file  Other Topics Concern   Not on file  Social History Narrative   Entered 07/2014:    Says her husband is "a disabled Vet"   Says he has Traumatic Brain Injury   She says that she is his caretaker.    When she has to leave for appointments etc. she has to arrange for other family to care for him.      Review of Systems  All other systems reviewed and are negative.  Objective:   Physical Exam Vitals signs reviewed.  Constitutional:      General: She is not in acute distress.    Appearance: Normal appearance. She is normal weight. She is not ill-appearing or toxic-appearing.  Cardiovascular:     Rate and Rhythm: Normal rate and regular rhythm.     Heart sounds: Normal heart sounds.  Pulmonary:     Effort: Pulmonary effort is normal.     Breath sounds: Normal breath sounds.  Abdominal:     Tenderness: There is no abdominal tenderness. There is no right CVA tenderness, left CVA tenderness or guarding.  Neurological:     Mental Status: She is alert.           Assessment & Plan:  Urinary tract infection with hematuria, site unspecified - Plan: Urinalysis, Routine w reflex microscopic  Elevated serum creatinine - Plan: BASIC METABOLIC PANEL WITH GFR  Begin work-up by obtaining a BMP.  I suspect that her elevated serum creatinine could be underlying mild chronic kidney disease exacerbated by NSAID overuse and dehydration.  His creatinine has improved since holding ibuprofen, I would continue to encourage the patient to drink plenty of water and repeat BMP in 1 week.  If creatinine is worse, I would recommend a renal ultrasound as well as an SPEP/UPEP.

## 2019-02-26 LAB — BASIC METABOLIC PANEL WITH GFR
BUN: 10 mg/dL (ref 7–25)
CO2: 25 mmol/L (ref 20–32)
Calcium: 9.9 mg/dL (ref 8.6–10.4)
Chloride: 108 mmol/L (ref 98–110)
Creat: 1.05 mg/dL (ref 0.50–1.05)
GFR, Est African American: 69 mL/min/{1.73_m2} (ref >=60)
GFR, Est Non African American: 59 mL/min/{1.73_m2} — ABNORMAL LOW (ref >=60)
Glucose, Bld: 92 mg/dL (ref 65–99)
Potassium: 4.2 mmol/L (ref 3.5–5.3)
Sodium: 139 mmol/L (ref 135–146)

## 2019-02-28 ENCOUNTER — Other Ambulatory Visit: Payer: Self-pay | Admitting: Family Medicine

## 2019-02-28 DIAGNOSIS — R7989 Other specified abnormal findings of blood chemistry: Secondary | ICD-10-CM

## 2019-06-03 ENCOUNTER — Other Ambulatory Visit: Payer: Self-pay | Admitting: Family Medicine

## 2019-06-03 NOTE — Telephone Encounter (Signed)
Ok to refill??  Last office visit 02/25/2019.  Last refill 12/12/2018, #5 refills.

## 2019-08-18 ENCOUNTER — Other Ambulatory Visit: Payer: Self-pay | Admitting: Family Medicine

## 2019-11-02 ENCOUNTER — Other Ambulatory Visit: Payer: Self-pay | Admitting: Family Medicine

## 2019-12-03 ENCOUNTER — Other Ambulatory Visit: Payer: Self-pay | Admitting: Family Medicine

## 2019-12-03 NOTE — Telephone Encounter (Signed)
Ok to refill??  Last office visit 02/25/2019.  Last refill 06/03/2019, #5 refills.   Of note, letter sent to patient to schedule F/U. Reduced refills to #3.

## 2019-12-17 ENCOUNTER — Other Ambulatory Visit: Payer: Self-pay

## 2019-12-17 MED ORDER — EST ESTROGENS-METHYLTEST DS 1.25-2.5 MG PO TABS: 1.0000 | ORAL_TABLET | Freq: Every day | ORAL | 0 refills | Status: DC

## 2019-12-22 ENCOUNTER — Telehealth: Payer: Self-pay

## 2019-12-22 NOTE — Telephone Encounter (Signed)
Pt is not able to refill Est Estrogens-Methyltest, Pharmacy is out of stock. She's wanting to know if she could be prescribed Estradiol?

## 2019-12-23 ENCOUNTER — Other Ambulatory Visit: Payer: Self-pay | Admitting: Family Medicine

## 2019-12-23 MED ORDER — ESTRADIOL 0.5 MG PO TABS
0.5000 mg | ORAL_TABLET | Freq: Every day | ORAL | 5 refills | Status: DC
Start: 2019-12-23 — End: 2020-06-21

## 2019-12-23 NOTE — Telephone Encounter (Signed)
Another rx was sent in to her pharmacy by provider

## 2019-12-23 NOTE — Telephone Encounter (Signed)
I sent estrace to her pharmacy

## 2019-12-29 ENCOUNTER — Ambulatory Visit: Admitting: Family Medicine

## 2020-01-15 ENCOUNTER — Ambulatory Visit: Admitting: Family Medicine

## 2020-01-15 ENCOUNTER — Other Ambulatory Visit: Payer: Self-pay

## 2020-06-21 ENCOUNTER — Other Ambulatory Visit: Payer: Self-pay | Admitting: Family Medicine

## 2020-12-22 ENCOUNTER — Other Ambulatory Visit: Payer: Self-pay | Admitting: Family Medicine

## 2021-07-12 ENCOUNTER — Other Ambulatory Visit: Payer: Self-pay | Admitting: Family Medicine

## 2021-07-12 NOTE — Telephone Encounter (Signed)
Estrace refill request.  Last seen

## 2021-09-13 ENCOUNTER — Other Ambulatory Visit: Payer: Self-pay

## 2021-09-13 ENCOUNTER — Other Ambulatory Visit: Payer: Self-pay | Admitting: Family Medicine

## 2021-09-13 DIAGNOSIS — Z1231 Encounter for screening mammogram for malignant neoplasm of breast: Secondary | ICD-10-CM

## 2021-10-18 ENCOUNTER — Ambulatory Visit

## 2021-10-25 ENCOUNTER — Inpatient Hospital Stay: Admission: RE | Admit: 2021-10-25 | Source: Ambulatory Visit

## 2021-11-23 ENCOUNTER — Ambulatory Visit

## 2022-07-19 ENCOUNTER — Encounter: Payer: Self-pay | Admitting: Nurse Practitioner

## 2022-08-24 ENCOUNTER — Other Ambulatory Visit: Payer: Self-pay | Admitting: Nurse Practitioner

## 2022-08-24 DIAGNOSIS — Z1231 Encounter for screening mammogram for malignant neoplasm of breast: Secondary | ICD-10-CM

## 2022-09-04 ENCOUNTER — Other Ambulatory Visit (HOSPITAL_COMMUNITY): Payer: Self-pay | Admitting: Physician Assistant

## 2022-09-04 DIAGNOSIS — G8929 Other chronic pain: Secondary | ICD-10-CM

## 2022-09-29 ENCOUNTER — Ambulatory Visit (HOSPITAL_COMMUNITY)

## 2022-10-11 ENCOUNTER — Encounter (HOSPITAL_COMMUNITY): Payer: Self-pay

## 2022-10-11 ENCOUNTER — Ambulatory Visit (HOSPITAL_COMMUNITY): Admission: RE | Admit: 2022-10-11 | Source: Ambulatory Visit

## 2022-10-23 ENCOUNTER — Ambulatory Visit

## 2022-11-07 ENCOUNTER — Other Ambulatory Visit (HOSPITAL_COMMUNITY): Payer: Self-pay | Admitting: Internal Medicine

## 2022-11-07 DIAGNOSIS — S0990XA Unspecified injury of head, initial encounter: Secondary | ICD-10-CM

## 2022-11-08 ENCOUNTER — Ambulatory Visit

## 2022-11-09 ENCOUNTER — Ambulatory Visit (HOSPITAL_COMMUNITY)

## 2022-11-12 ENCOUNTER — Ambulatory Visit (HOSPITAL_BASED_OUTPATIENT_CLINIC_OR_DEPARTMENT_OTHER)

## 2022-11-12 ENCOUNTER — Encounter (HOSPITAL_BASED_OUTPATIENT_CLINIC_OR_DEPARTMENT_OTHER): Payer: Self-pay

## 2022-11-24 ENCOUNTER — Ambulatory Visit
Admission: RE | Admit: 2022-11-24 | Discharge: 2022-11-24 | Disposition: A | Discharge status: Home / Self Care | Source: Ambulatory Visit | Attending: Nurse Practitioner | Admitting: Nurse Practitioner

## 2022-11-24 DIAGNOSIS — Z1231 Encounter for screening mammogram for malignant neoplasm of breast: Secondary | ICD-10-CM

## 2022-12-12 ENCOUNTER — Ambulatory Visit (HOSPITAL_COMMUNITY)

## 2023-01-08 ENCOUNTER — Ambulatory Visit (HOSPITAL_COMMUNITY)
Admission: RE | Admit: 2023-01-08 | Discharge: 2023-01-08 | Disposition: A | Discharge status: Home / Self Care | Source: Ambulatory Visit | Attending: Physician Assistant | Admitting: Physician Assistant

## 2023-01-08 DIAGNOSIS — M25561 Pain in right knee: Secondary | ICD-10-CM | POA: Diagnosis present

## 2023-01-08 DIAGNOSIS — G8929 Other chronic pain: Secondary | ICD-10-CM | POA: Insufficient documentation

## 2023-11-26 ENCOUNTER — Other Ambulatory Visit: Payer: Self-pay | Admitting: Physician Assistant

## 2023-11-26 DIAGNOSIS — Z1231 Encounter for screening mammogram for malignant neoplasm of breast: Secondary | ICD-10-CM

## 2023-12-28 ENCOUNTER — Ambulatory Visit
Admission: RE | Admit: 2023-12-28 | Discharge: 2023-12-28 | Disposition: A | Discharge status: Home / Self Care | Source: Ambulatory Visit | Attending: Physician Assistant | Admitting: Physician Assistant

## 2023-12-28 DIAGNOSIS — Z1231 Encounter for screening mammogram for malignant neoplasm of breast: Secondary | ICD-10-CM
